# Patient Record
Sex: Female | Born: 1980
Health system: Southern US, Community
[De-identification: ages and names within clinical notes are randomized; demographics above are authoritative.]

## PROBLEM LIST (undated history)

## (undated) DIAGNOSIS — R51 Headache: Secondary | ICD-10-CM

## (undated) DIAGNOSIS — N200 Calculus of kidney: Secondary | ICD-10-CM

## (undated) DIAGNOSIS — Z9889 Other specified postprocedural states: Secondary | ICD-10-CM

## (undated) DIAGNOSIS — K429 Umbilical hernia without obstruction or gangrene: Secondary | ICD-10-CM

## (undated) DIAGNOSIS — O009 Unspecified ectopic pregnancy without intrauterine pregnancy: Secondary | ICD-10-CM

## (undated) DIAGNOSIS — J310 Chronic rhinitis: Secondary | ICD-10-CM

## (undated) DIAGNOSIS — K5900 Constipation, unspecified: Secondary | ICD-10-CM

## (undated) DIAGNOSIS — R591 Generalized enlarged lymph nodes: Secondary | ICD-10-CM

## (undated) DIAGNOSIS — D649 Anemia, unspecified: Secondary | ICD-10-CM

## (undated) DIAGNOSIS — J329 Chronic sinusitis, unspecified: Secondary | ICD-10-CM

## (undated) DIAGNOSIS — T7840XA Allergy, unspecified, initial encounter: Secondary | ICD-10-CM

## (undated) DIAGNOSIS — S46009A Unspecified injury of muscle(s) and tendon(s) of the rotator cuff of unspecified shoulder, initial encounter: Secondary | ICD-10-CM

## (undated) DIAGNOSIS — R112 Nausea with vomiting, unspecified: Secondary | ICD-10-CM

## (undated) DIAGNOSIS — M779 Enthesopathy, unspecified: Secondary | ICD-10-CM

## (undated) HISTORY — DX: Allergy, unspecified, initial encounter: T78.40XA

## (undated) HISTORY — DX: Anemia, unspecified: D64.9

## (undated) HISTORY — PX: APPENDECTOMY: SHX54

---

## 2012-06-04 DIAGNOSIS — O009 Unspecified ectopic pregnancy without intrauterine pregnancy: Secondary | ICD-10-CM

## 2012-06-04 HISTORY — DX: Unspecified ectopic pregnancy without intrauterine pregnancy: O00.90

## 2013-03-02 LAB — OB RESULTS CONSOLE GC/CHLAMYDIA
CHLAMYDIA, DNA PROBE: NEGATIVE
GC PROBE AMP, GENITAL: NEGATIVE

## 2013-03-02 LAB — OB RESULTS CONSOLE ABO/RH: RH Type: NEGATIVE

## 2013-03-02 LAB — OB RESULTS CONSOLE HEPATITIS B SURFACE ANTIGEN: HEP B S AG: NEGATIVE

## 2013-03-02 LAB — OB RESULTS CONSOLE ANTIBODY SCREEN: Antibody Screen: NEGATIVE

## 2013-03-02 LAB — OB RESULTS CONSOLE HIV ANTIBODY (ROUTINE TESTING): HIV: NONREACTIVE

## 2013-03-02 LAB — OB RESULTS CONSOLE RPR: RPR: NONREACTIVE

## 2013-03-02 LAB — OB RESULTS CONSOLE RUBELLA ANTIBODY, IGM: RUBELLA: IMMUNE

## 2013-07-05 ENCOUNTER — Encounter (HOSPITAL_COMMUNITY): Payer: Self-pay | Admitting: *Deleted

## 2013-07-05 ENCOUNTER — Inpatient Hospital Stay (HOSPITAL_COMMUNITY)
Admission: AD | Admit: 2013-07-05 | Discharge: 2013-07-05 | Disposition: A | Discharge status: Home / Self Care | Payer: BC Managed Care – PPO | Source: Ambulatory Visit | Attending: Obstetrics and Gynecology | Admitting: Obstetrics and Gynecology

## 2013-07-05 DIAGNOSIS — R109 Unspecified abdominal pain: Secondary | ICD-10-CM | POA: Insufficient documentation

## 2013-07-05 DIAGNOSIS — K5289 Other specified noninfective gastroenteritis and colitis: Secondary | ICD-10-CM

## 2013-07-05 DIAGNOSIS — O9989 Other specified diseases and conditions complicating pregnancy, childbirth and the puerperium: Principal | ICD-10-CM

## 2013-07-05 DIAGNOSIS — O99891 Other specified diseases and conditions complicating pregnancy: Secondary | ICD-10-CM | POA: Insufficient documentation

## 2013-07-05 DIAGNOSIS — M549 Dorsalgia, unspecified: Secondary | ICD-10-CM | POA: Insufficient documentation

## 2013-07-05 DIAGNOSIS — R1110 Vomiting, unspecified: Secondary | ICD-10-CM | POA: Insufficient documentation

## 2013-07-05 DIAGNOSIS — K529 Noninfective gastroenteritis and colitis, unspecified: Secondary | ICD-10-CM

## 2013-07-05 HISTORY — DX: Unspecified ectopic pregnancy without intrauterine pregnancy: O00.90

## 2013-07-05 LAB — URINE MICROSCOPIC-ADD ON

## 2013-07-05 LAB — URINALYSIS, ROUTINE W REFLEX MICROSCOPIC
Bilirubin Urine: NEGATIVE
Glucose, UA: NEGATIVE mg/dL
HGB URINE DIPSTICK: NEGATIVE
Ketones, ur: NEGATIVE mg/dL
Nitrite: NEGATIVE
PROTEIN: NEGATIVE mg/dL
Specific Gravity, Urine: 1.02 (ref 1.005–1.030)
UROBILINOGEN UA: 0.2 mg/dL (ref 0.0–1.0)
pH: 6.5 (ref 5.0–8.0)

## 2013-07-05 MED ORDER — ONDANSETRON HCL 8 MG PO TABS: 8.0000 mg | ORAL_TABLET | Freq: Three times a day (TID) | ORAL | Status: DC | PRN

## 2013-07-05 MED ORDER — ONDANSETRON 8 MG PO TBDP
8.0000 mg | ORAL_TABLET | Freq: Once | ORAL | Status: AC
  Administered 2013-07-05: 8 mg via ORAL
  Filled 2013-07-05: qty 1

## 2013-07-05 NOTE — Discharge Instructions (Signed)
Viral Gastroenteritis Viral gastroenteritis is also known as stomach flu. This condition affects the stomach and intestinal tract. It can cause sudden diarrhea and vomiting. The illness typically lasts 3 to 8 days. Most people develop an immune response that eventually gets rid of the virus. While this natural response develops, the virus can make you quite ill. CAUSES  Many different viruses can cause gastroenteritis, such as rotavirus or noroviruses. You can catch one of these viruses by consuming contaminated food or water. You may also catch a virus by sharing utensils or other personal items with an infected person or by touching a contaminated surface. SYMPTOMS  The most common symptoms are diarrhea and vomiting. These problems can cause a severe loss of body fluids (dehydration) and a body salt (electrolyte) imbalance. Other symptoms may include:  Fever.  Headache.  Fatigue.  Abdominal pain. DIAGNOSIS  Your caregiver can usually diagnose viral gastroenteritis based on your symptoms and a physical exam. A stool sample may also be taken to test for the presence of viruses or other infections. TREATMENT  This illness typically goes away on its own. Treatments are aimed at rehydration. The most serious cases of viral gastroenteritis involve vomiting so severely that you are not able to keep fluids down. In these cases, fluids must be given through an intravenous line (IV). HOME CARE INSTRUCTIONS   Drink enough fluids to keep your urine clear or pale yellow. Drink small amounts of fluids frequently and increase the amounts as tolerated.  Ask your caregiver for specific rehydration instructions.  Avoid:  Foods high in sugar.  Alcohol.  Carbonated drinks.  Tobacco.  Juice.  Caffeine drinks.  Extremely hot or cold fluids.  Fatty, greasy foods.  Too much intake of anything at one time.  Dairy products until 24 to 48 hours after diarrhea stops.  You may consume probiotics.  Probiotics are active cultures of beneficial bacteria. They may lessen the amount and number of diarrheal stools in adults. Probiotics can be found in yogurt with active cultures and in supplements.  Wash your hands well to avoid spreading the virus.  Only take over-the-counter or prescription medicines for pain, discomfort, or fever as directed by your caregiver. Do not give aspirin to children. Antidiarrheal medicines are not recommended.  Ask your caregiver if you should continue to take your regular prescribed and over-the-counter medicines.  Keep all follow-up appointments as directed by your caregiver. SEEK IMMEDIATE MEDICAL CARE IF:   You are unable to keep fluids down.  You do not urinate at least once every 6 to 8 hours.  You develop shortness of breath.  You notice blood in your stool or vomit. This may look like coffee grounds.  You have abdominal pain that increases or is concentrated in one small area (localized).  You have persistent vomiting or diarrhea.  You have a fever.  The patient is a child younger than 3 months, and he or she has a fever.  The patient is a child older than 3 months, and he or she has a fever and persistent symptoms.  The patient is a child older than 3 months, and he or she has a fever and symptoms suddenly get worse.  The patient is a baby, and he or she has no tears when crying. MAKE SURE YOU:   Understand these instructions.  Will watch your condition.  Will get help right away if you are not doing well or get worse. Document Released: 05/21/2005 Document Revised: 08/13/2011 Document Reviewed: 03/07/2011   [employer] Patient Information 2014 [employer].  

## 2013-07-05 NOTE — MAU Provider Note (Signed)
  History     CSN: [id number]  Arrival date and time: 07/05/13 1840   First Provider Initiated Contact with Patient 07/05/13 1904      Chief Complaint  Patient presents with  . Emesis  . Abdominal Pain  . Back Pain   HPI Tiffany Carter 33 y.o. [redacted]w[redacted]d  Client had sudden onset of feeling bad and vomiting this afternoon.  Has vomited twice.  Does not have any diarrhea.  Does report some lower abdominal cramping.  OB History   Grav Para Term Preterm Abortions TAB SAB Ect Mult Living   1         0      History reviewed. No pertinent past medical history.  No past surgical history on file.  No family history on file.  History  Substance Use Topics  . Smoking status: Not on file  . Smokeless tobacco: Not on file  . Alcohol Use: Not on file    Allergies:  Allergies  Allergen Reactions  . Penicillins Hives    No prescriptions prior to admission    Review of Systems  Constitutional: Negative for fever.  Gastrointestinal: Positive for nausea, vomiting and abdominal pain. Negative for diarrhea.  Genitourinary:       No vaginal discharge. No vaginal bleeding. No dysuria.   Physical Exam   Blood pressure 111/60, pulse 84, temperature 97.8 F (36.6 C), temperature source Oral, resp. rate 18, height 5\' 4"  (1.626 m), weight 136 lb (61.689 kg), last menstrual period 01/28/2013.  Physical Exam  Nursing note and vitals reviewed. Constitutional: She is oriented to person, place, and time. She appears well-developed and well-nourished.  HENT:  Head: Normocephalic.  Eyes: EOM are normal.  Neck: Neck supple.  Respiratory: Effort normal.  GI: Soft. There is no tenderness.  Lower abdomen is very soft.  No contractions palpated.  Musculoskeletal: Normal range of motion.  Neurological: She is alert and oriented to person, place, and time.  Skin: Skin is warm and dry.  Psychiatric: She has a normal mood and affect.    MAU Course  Procedures  MDM  Care assumed by M.  Jimmye Norman, CNM  Assessment and Plan    BURLESON,TERRI 07/05/2013, 7:10 PM   Zofran given with excellent relief But patient has now developed loose stools, so this is probably gastroenteritis.  Wants to go home.  Rx Zofran given.  Dehydration precautions.   Seabron Spates, CNM

## 2013-07-05 NOTE — MAU Note (Signed)
Patient states she started having abdominal and back pain with vomiting this afternoon. Denies bleeding or leaking and reports feeling fetal movement.

## 2013-10-19 ENCOUNTER — Encounter (HOSPITAL_COMMUNITY): Payer: Self-pay | Admitting: Pharmacist

## 2013-10-27 ENCOUNTER — Encounter (HOSPITAL_COMMUNITY): Payer: Self-pay

## 2013-10-27 ENCOUNTER — Other Ambulatory Visit (HOSPITAL_COMMUNITY): Payer: BC Managed Care – PPO

## 2013-10-27 ENCOUNTER — Encounter (HOSPITAL_COMMUNITY)
Admission: RE | Admit: 2013-10-27 | Discharge: 2013-10-27 | Disposition: A | Discharge status: Home / Self Care | Payer: BC Managed Care – PPO | Source: Ambulatory Visit | Attending: Obstetrics & Gynecology | Admitting: Obstetrics & Gynecology

## 2013-10-27 HISTORY — DX: Other specified postprocedural states: Z98.890

## 2013-10-27 HISTORY — DX: Enthesopathy, unspecified: M77.9

## 2013-10-27 HISTORY — DX: Headache: R51

## 2013-10-27 HISTORY — DX: Nausea with vomiting, unspecified: R11.2

## 2013-10-27 HISTORY — DX: Unspecified injury of muscle(s) and tendon(s) of the rotator cuff of unspecified shoulder, initial encounter: S46.009A

## 2013-10-27 LAB — CBC
HCT: 37.2 % (ref 36.0–46.0)
Hemoglobin: 12.9 g/dL (ref 12.0–15.0)
MCH: 30.7 pg (ref 26.0–34.0)
MCHC: 34.7 g/dL (ref 30.0–36.0)
MCV: 88.6 fL (ref 78.0–100.0)
PLATELETS: 175 10*3/uL (ref 150–400)
RBC: 4.2 MIL/uL (ref 3.87–5.11)
RDW: 13.6 % (ref 11.5–15.5)
WBC: 8.7 10*3/uL (ref 4.0–10.5)

## 2013-10-27 LAB — PREPARE RBC (CROSSMATCH)

## 2013-10-27 LAB — RPR

## 2013-10-27 NOTE — Patient Instructions (Addendum)
Your procedure is scheduled on: Wednesday, Oct 28, 2013  Enter through the [employer] [facility] at: 6:00am  Pick up the phone at the desk and dial [phone].  Call this number if you have problems the morning of surgery: [phone].  Remember: Do NOT eat food:  After midnight tonight Do NOT drink clear liquids after:  After midnight tonight Take these medicines the morning of surgery with a SIP OF WATER: None  Do NOT wear jewelry (body piercing), metal hair clips/bobby pins, make-up, or nail polish. Do NOT wear lotions, powders, or perfumes.  You may wear deoderant. Do NOT shave for 48 hours prior to surgery. Do NOT bring valuables to the hospital.   Leave suitcase in car.  After surgery it may be brought to your room.  For patients admitted to the hospital, checkout time is 11:00 AM the day of discharge.

## 2013-10-27 NOTE — H&P (Signed)
Tiffany Carter is a 33 y.o. female presenting for repeat C/S with BTL.  Her antepartum course has been uncomplicated.  She is Rh negative and GBS negative.    Maternal Medical History:  Fetal activity: Perceived fetal activity is normal.   Last perceived fetal movement was within the past hour.    Prenatal complications: no prenatal complications Prenatal Complications - Diabetes: none.    OB History   Grav Para Term Preterm Abortions TAB SAB Ect Mult Living   4 1 1  2  1 1  1      Past Medical History  Diagnosis Date  . Ectopic pregnancy 2014  . Headache(784.0)     history of migraines over 15 years ago  . Tendonitis     left  . Rotator cuff injury     right  . PONV (postoperative nausea and vomiting)    Past Surgical History  Procedure Laterality Date  . Appendectomy    . Cesarean section     Family History: family history includes Cancer in her mother. Social History:  reports that she has never smoked. She has never used smokeless tobacco. She reports that she drinks alcohol. She reports that she does not use illicit drugs.   Prenatal Transfer Tool  Maternal Diabetes: No Genetic Screening: Normal Maternal Ultrasounds/Referrals: Normal Fetal Ultrasounds or other Referrals:  None Maternal Substance Abuse:  No Significant Maternal Medications:  None Significant Maternal Lab Results:  Lab values include: Group B Strep negative Other Comments:  None  ROS    Last menstrual period 01/28/2013, unknown if currently breastfeeding. Maternal Exam:  Abdomen: Patient reports no abdominal tenderness. Surgical scars: low transverse.   Fundal height is c/w dates.   Estimated fetal weight is 7#4.       Physical Exam  Constitutional: She is oriented to person, place, and time. She appears well-developed and well-nourished.  GI: Soft. There is no rebound and no guarding.  Neurological: She is alert and oriented to person, place, and time.  Skin: Skin is warm and dry.   Psychiatric: She has a normal mood and affect. Her behavior is normal.    Prenatal labs: ABO, Rh: --/--/O NEG (05/26 1440) Antibody: POS (05/26 1440) Rubella: Immune (09/29 0000) RPR: Nonreactive (09/29 0000)  HBsAg: Negative (09/29 0000)  HIV: Non-reactive (09/29 0000)  GBS:     Assessment/Plan: 33yo [id number] at 39 weeks with h/o C/S and desire for permanent sterilization Repeat C/S with BTL  Linda Hedges 10/27/2013, 8:08 PM

## 2013-10-28 ENCOUNTER — Encounter (HOSPITAL_COMMUNITY): Payer: BC Managed Care – PPO | Admitting: Anesthesiology

## 2013-10-28 ENCOUNTER — Encounter (HOSPITAL_COMMUNITY): Payer: Self-pay | Admitting: *Deleted

## 2013-10-28 ENCOUNTER — Inpatient Hospital Stay (HOSPITAL_COMMUNITY): Payer: BC Managed Care – PPO | Admitting: Anesthesiology

## 2013-10-28 ENCOUNTER — Encounter (HOSPITAL_COMMUNITY)
Admission: RE | Disposition: A | Discharge status: Home / Self Care | Payer: Self-pay | Source: Ambulatory Visit | Attending: Obstetrics & Gynecology

## 2013-10-28 ENCOUNTER — Inpatient Hospital Stay (HOSPITAL_COMMUNITY)
Admission: RE | Admit: 2013-10-28 | Discharge: 2013-10-30 | DRG: 766 | Disposition: A | Discharge status: Home / Self Care | Payer: BC Managed Care – PPO | Source: Ambulatory Visit | Attending: Obstetrics & Gynecology | Admitting: Obstetrics & Gynecology

## 2013-10-28 DIAGNOSIS — Z302 Encounter for sterilization: Secondary | ICD-10-CM

## 2013-10-28 DIAGNOSIS — O360990 Maternal care for other rhesus isoimmunization, unspecified trimester, not applicable or unspecified: Secondary | ICD-10-CM | POA: Diagnosis present

## 2013-10-28 DIAGNOSIS — O34219 Maternal care for unspecified type scar from previous cesarean delivery: Principal | ICD-10-CM | POA: Diagnosis present

## 2013-10-28 DIAGNOSIS — Z98891 History of uterine scar from previous surgery: Secondary | ICD-10-CM

## 2013-10-28 SURGERY — Surgical Case
Anesthesia: Spinal | Site: Abdomen | Laterality: Bilateral

## 2013-10-28 MED ORDER — OXYTOCIN 10 UNIT/ML IJ SOLN
INTRAMUSCULAR | Status: AC
  Filled 2013-10-28: qty 4

## 2013-10-28 MED ORDER — ONDANSETRON 8 MG/NS 50 ML IVPB
8.0000 mg | Freq: Once | INTRAVENOUS | Status: DC
  Filled 2013-10-28: qty 8

## 2013-10-28 MED ORDER — PHENYLEPHRINE 8 MG IN D5W 100 ML (0.08MG/ML) PREMIX OPTIME
INJECTION | INTRAVENOUS | Status: DC | PRN
  Administered 2013-10-28: 60 ug/min via INTRAVENOUS

## 2013-10-28 MED ORDER — DIPHENHYDRAMINE HCL 50 MG/ML IJ SOLN: 25.0000 mg | INTRAMUSCULAR | Status: DC | PRN

## 2013-10-28 MED ORDER — ONDANSETRON HCL 4 MG/2ML IJ SOLN
INTRAMUSCULAR | Status: DC | PRN
  Administered 2013-10-28: 4 mg via INTRAVENOUS

## 2013-10-28 MED ORDER — SCOPOLAMINE 1 MG/3DAYS TD PT72
1.0000 | MEDICATED_PATCH | Freq: Once | TRANSDERMAL | Status: DC
  Administered 2013-10-28: 1.5 mg via TRANSDERMAL

## 2013-10-28 MED ORDER — DIPHENHYDRAMINE HCL 25 MG PO CAPS: 25.0000 mg | ORAL_CAPSULE | ORAL | Status: DC | PRN

## 2013-10-28 MED ORDER — SIMETHICONE 80 MG PO CHEW
80.0000 mg | CHEWABLE_TABLET | ORAL | Status: DC | PRN
  Administered 2013-10-29: 80 mg via ORAL

## 2013-10-28 MED ORDER — OXYTOCIN 40 UNITS IN LACTATED RINGERS INFUSION - SIMPLE MED: 62.5000 mL/h | INTRAVENOUS | Status: AC

## 2013-10-28 MED ORDER — ONDANSETRON HCL 4 MG/2ML IJ SOLN
INTRAMUSCULAR | Status: AC
  Filled 2013-10-28: qty 2

## 2013-10-28 MED ORDER — IBUPROFEN 600 MG PO TABS
600.0000 mg | ORAL_TABLET | Freq: Four times a day (QID) | ORAL | Status: DC
  Administered 2013-10-29 – 2013-10-30 (×6): 600 mg via ORAL
  Filled 2013-10-28 (×6): qty 1

## 2013-10-28 MED ORDER — METOCLOPRAMIDE HCL 5 MG/ML IJ SOLN
INTRAMUSCULAR | Status: AC
  Filled 2013-10-28: qty 2

## 2013-10-28 MED ORDER — TETANUS-DIPHTH-ACELL PERTUSSIS 5-2.5-18.5 LF-MCG/0.5 IM SUSP: 0.5000 mL | Freq: Once | INTRAMUSCULAR | Status: DC

## 2013-10-28 MED ORDER — SCOPOLAMINE 1 MG/3DAYS TD PT72
1.0000 | MEDICATED_PATCH | Freq: Once | TRANSDERMAL | Status: DC
  Filled 2013-10-28: qty 1

## 2013-10-28 MED ORDER — LANOLIN HYDROUS EX OINT: 1.0000 "application " | TOPICAL_OINTMENT | CUTANEOUS | Status: DC | PRN

## 2013-10-28 MED ORDER — OXYTOCIN 10 UNIT/ML IJ SOLN
40.0000 [IU] | INTRAVENOUS | Status: DC | PRN
  Administered 2013-10-28: 40 [IU] via INTRAVENOUS

## 2013-10-28 MED ORDER — FENTANYL CITRATE 0.05 MG/ML IJ SOLN: 25.0000 ug | INTRAMUSCULAR | Status: DC | PRN

## 2013-10-28 MED ORDER — SIMETHICONE 80 MG PO CHEW
80.0000 mg | CHEWABLE_TABLET | ORAL | Status: DC
  Administered 2013-10-29 (×2): 80 mg via ORAL
  Filled 2013-10-28 (×2): qty 1

## 2013-10-28 MED ORDER — SODIUM CHLORIDE 0.9 % IJ SOLN
3.0000 mL | INTRAMUSCULAR | Status: DC | PRN
Start: 2013-10-28 — End: 2013-10-30

## 2013-10-28 MED ORDER — ONDANSETRON HCL 4 MG/2ML IJ SOLN: 4.0000 mg | INTRAMUSCULAR | Status: DC | PRN

## 2013-10-28 MED ORDER — SIMETHICONE 80 MG PO CHEW
80.0000 mg | CHEWABLE_TABLET | Freq: Three times a day (TID) | ORAL | Status: DC
  Administered 2013-10-29 – 2013-10-30 (×3): 80 mg via ORAL
  Filled 2013-10-28 (×4): qty 1

## 2013-10-28 MED ORDER — GENTAMICIN SULFATE 40 MG/ML IJ SOLN
INTRAVENOUS | Status: AC
  Administered 2013-10-28: 100 mL via INTRAVENOUS
  Filled 2013-10-28: qty 7.5

## 2013-10-28 MED ORDER — MEPERIDINE HCL 25 MG/ML IJ SOLN: 6.2500 mg | INTRAMUSCULAR | Status: DC | PRN

## 2013-10-28 MED ORDER — ZOLPIDEM TARTRATE 5 MG PO TABS: 5.0000 mg | ORAL_TABLET | Freq: Every evening | ORAL | Status: DC | PRN

## 2013-10-28 MED ORDER — WITCH HAZEL-GLYCERIN EX PADS: 1.0000 "application " | MEDICATED_PAD | CUTANEOUS | Status: DC | PRN

## 2013-10-28 MED ORDER — BUPIVACAINE IN DEXTROSE 0.75-8.25 % IT SOLN
INTRATHECAL | Status: DC | PRN
  Administered 2013-10-28: 11 mg via INTRATHECAL

## 2013-10-28 MED ORDER — DIPHENHYDRAMINE HCL 50 MG/ML IJ SOLN: 12.5000 mg | INTRAMUSCULAR | Status: DC | PRN

## 2013-10-28 MED ORDER — FENTANYL CITRATE 0.05 MG/ML IJ SOLN
INTRAMUSCULAR | Status: DC | PRN
  Administered 2013-10-28: 25 ug via INTRATHECAL

## 2013-10-28 MED ORDER — LACTATED RINGERS IV SOLN
INTRAVENOUS | Status: DC | PRN
  Administered 2013-10-28 (×3): via INTRAVENOUS

## 2013-10-28 MED ORDER — ONDANSETRON HCL 4 MG/2ML IJ SOLN
INTRAMUSCULAR | Status: AC
  Administered 2013-10-28: 8 mg
  Filled 2013-10-28: qty 4

## 2013-10-28 MED ORDER — NALOXONE HCL 0.4 MG/ML IJ SOLN: 0.4000 mg | INTRAMUSCULAR | Status: DC | PRN

## 2013-10-28 MED ORDER — KETOROLAC TROMETHAMINE 30 MG/ML IJ SOLN
30.0000 mg | Freq: Four times a day (QID) | INTRAMUSCULAR | Status: AC | PRN
  Administered 2013-10-28: 30 mg via INTRAMUSCULAR

## 2013-10-28 MED ORDER — SENNOSIDES-DOCUSATE SODIUM 8.6-50 MG PO TABS
2.0000 | ORAL_TABLET | ORAL | Status: DC
  Administered 2013-10-29 (×2): 2 via ORAL
  Filled 2013-10-28 (×2): qty 2

## 2013-10-28 MED ORDER — DIPHENHYDRAMINE HCL 25 MG PO CAPS: 25.0000 mg | ORAL_CAPSULE | Freq: Four times a day (QID) | ORAL | Status: DC | PRN

## 2013-10-28 MED ORDER — LACTATED RINGERS IV SOLN
INTRAVENOUS | Status: DC
  Administered 2013-10-28 (×2): via INTRAVENOUS

## 2013-10-28 MED ORDER — FENTANYL CITRATE 0.05 MG/ML IJ SOLN
INTRAMUSCULAR | Status: AC
  Filled 2013-10-28: qty 2

## 2013-10-28 MED ORDER — DEXTROSE IN LACTATED RINGERS 5 % IV SOLN: INTRAVENOUS | Status: DC

## 2013-10-28 MED ORDER — SCOPOLAMINE 1 MG/3DAYS TD PT72
MEDICATED_PATCH | TRANSDERMAL | Status: AC
  Administered 2013-10-28: 1.5 mg via TRANSDERMAL
  Filled 2013-10-28: qty 1

## 2013-10-28 MED ORDER — MORPHINE SULFATE (PF) 0.5 MG/ML IJ SOLN
INTRAMUSCULAR | Status: DC | PRN
  Administered 2013-10-28: .15 mg via INTRATHECAL

## 2013-10-28 MED ORDER — LACTATED RINGERS IV SOLN
INTRAVENOUS | Status: DC
  Administered 2013-10-28: 07:00:00 via INTRAVENOUS

## 2013-10-28 MED ORDER — NALOXONE HCL 1 MG/ML IJ SOLN
1.0000 ug/kg/h | INTRAVENOUS | Status: DC | PRN
  Filled 2013-10-28: qty 2

## 2013-10-28 MED ORDER — ONDANSETRON HCL 4 MG PO TABS: 4.0000 mg | ORAL_TABLET | ORAL | Status: DC | PRN

## 2013-10-28 MED ORDER — PRENATAL MULTIVITAMIN CH
1.0000 | ORAL_TABLET | Freq: Every day | ORAL | Status: DC
  Administered 2013-10-29: 1 via ORAL
  Filled 2013-10-28: qty 1

## 2013-10-28 MED ORDER — ONDANSETRON HCL 4 MG/2ML IJ SOLN: 4.0000 mg | Freq: Three times a day (TID) | INTRAMUSCULAR | Status: DC | PRN

## 2013-10-28 MED ORDER — NALBUPHINE HCL 10 MG/ML IJ SOLN: 5.0000 mg | INTRAMUSCULAR | Status: DC | PRN

## 2013-10-28 MED ORDER — MORPHINE SULFATE 0.5 MG/ML IJ SOLN
INTRAMUSCULAR | Status: AC
  Filled 2013-10-28: qty 10

## 2013-10-28 MED ORDER — DIBUCAINE 1 % RE OINT: 1.0000 "application " | TOPICAL_OINTMENT | RECTAL | Status: DC | PRN

## 2013-10-28 MED ORDER — KETOROLAC TROMETHAMINE 30 MG/ML IJ SOLN
30.0000 mg | Freq: Four times a day (QID) | INTRAMUSCULAR | Status: AC | PRN
  Administered 2013-10-28: 30 mg via INTRAVENOUS
  Filled 2013-10-28: qty 1

## 2013-10-28 MED ORDER — HYDROCODONE-ACETAMINOPHEN 5-325 MG PO TABS
1.0000 | ORAL_TABLET | ORAL | Status: DC | PRN
  Administered 2013-10-29 (×2): 1 via ORAL
  Administered 2013-10-29: 0.5 via ORAL
  Administered 2013-10-29 – 2013-10-30 (×6): 1 via ORAL
  Filled 2013-10-28 (×9): qty 1

## 2013-10-28 MED ORDER — MENTHOL 3 MG MT LOZG: 1.0000 | LOZENGE | OROMUCOSAL | Status: DC | PRN

## 2013-10-28 MED ORDER — KETOROLAC TROMETHAMINE 30 MG/ML IJ SOLN
INTRAMUSCULAR | Status: AC
  Administered 2013-10-28: 30 mg via INTRAMUSCULAR
  Filled 2013-10-28: qty 1

## 2013-10-28 MED ORDER — METOCLOPRAMIDE HCL 5 MG/ML IJ SOLN
10.0000 mg | Freq: Three times a day (TID) | INTRAMUSCULAR | Status: DC | PRN
  Administered 2013-10-28: 10 mg via INTRAVENOUS

## 2013-10-28 MED ORDER — PHENYLEPHRINE 8 MG IN D5W 100 ML (0.08MG/ML) PREMIX OPTIME
INJECTION | INTRAVENOUS | Status: AC
  Filled 2013-10-28: qty 100

## 2013-10-28 SURGICAL SUPPLY — 34 items
BENZOIN TINCTURE PRP APPL 2/3 (GAUZE/BANDAGES/DRESSINGS) ×2 IMPLANT
CLAMP CORD UMBIL (MISCELLANEOUS) IMPLANT
CLOTH BEACON ORANGE TIMEOUT ST (SAFETY) ×2 IMPLANT
DERMABOND ADVANCED (GAUZE/BANDAGES/DRESSINGS)
DERMABOND ADVANCED .7 DNX12 (GAUZE/BANDAGES/DRESSINGS) IMPLANT
DRAPE LG THREE QUARTER DISP (DRAPES) IMPLANT
DRSG OPSITE POSTOP 4X10 (GAUZE/BANDAGES/DRESSINGS) ×2 IMPLANT
DURAPREP 26ML APPLICATOR (WOUND CARE) ×2 IMPLANT
ELECT REM PT RETURN 9FT ADLT (ELECTROSURGICAL) ×2
ELECTRODE REM PT RTRN 9FT ADLT (ELECTROSURGICAL) ×1 IMPLANT
EXTRACTOR VACUUM KIWI (MISCELLANEOUS) IMPLANT
EXTRACTOR VACUUM M CUP 4 TUBE (SUCTIONS) IMPLANT
GLOVE BIO SURGEON STRL SZ 6 (GLOVE) ×2 IMPLANT
GLOVE BIOGEL PI IND STRL 6 (GLOVE) ×2 IMPLANT
GLOVE BIOGEL PI INDICATOR 6 (GLOVE) ×2
GOWN STRL REUS W/TWL LRG LVL3 (GOWN DISPOSABLE) ×4 IMPLANT
KIT ABG SYR 3ML LUER SLIP (SYRINGE) ×2 IMPLANT
NEEDLE HYPO 25X5/8 SAFETYGLIDE (NEEDLE) ×2 IMPLANT
NS IRRIG 1000ML POUR BTL (IV SOLUTION) ×2 IMPLANT
PACK C SECTION WH (CUSTOM PROCEDURE TRAY) ×2 IMPLANT
PAD OB MATERNITY 4.3X12.25 (PERSONAL CARE ITEMS) ×2 IMPLANT
SPONGE SURGIFOAM ABS GEL 12-7 (HEMOSTASIS) ×2 IMPLANT
STAPLER VISISTAT 35W (STAPLE) IMPLANT
STRIP CLOSURE SKIN 1/2X4 (GAUZE/BANDAGES/DRESSINGS) ×2 IMPLANT
SUT CHROMIC 0 CTX 36 (SUTURE) ×6 IMPLANT
SUT CHROMIC 3 0 SH 27 (SUTURE) ×2 IMPLANT
SUT MON AB 2-0 CT1 27 (SUTURE) ×2 IMPLANT
SUT PDS AB 0 CT1 27 (SUTURE) IMPLANT
SUT PLAIN 0 NONE (SUTURE) IMPLANT
SUT VIC AB 0 CT1 36 (SUTURE) IMPLANT
SUT VIC AB 4-0 KS 27 (SUTURE) IMPLANT
TOWEL OR 17X24 6PK STRL BLUE (TOWEL DISPOSABLE) ×2 IMPLANT
TRAY FOLEY CATH 14FR (SET/KITS/TRAYS/PACK) IMPLANT
WATER STERILE IRR 1000ML POUR (IV SOLUTION) ×2 IMPLANT

## 2013-10-28 NOTE — Op Note (Signed)
Tiffany Carter PROCEDURE DATE: 10/28/2013  PREOPERATIVE DIAGNOSIS: Intrauterine pregnancy at  [redacted]w[redacted]d weeks gestation with previous C/S and desire for permanent sterilization  POSTOPERATIVE DIAGNOSIS: The same  PROCEDURE:   Repeat Low Transverse Cesarean Section with Bilateral Tubal Ligation  SURGEON:  Dr. Linda Hedges  INDICATIONS: Tiffany Carter is a 33 y.o. [id number] at [redacted]w[redacted]d scheduled for cesarean section and BTL secondary to desire for repeat and permanent sterilization.  The risks of cesarean section discussed with the patient included but were not limited to: bleeding which may require transfusion or reoperation; infection which may require antibiotics; injury to bowel, bladder, ureters or other surrounding organs; injury to the fetus; need for additional procedures including hysterectomy in the event of a life-threatening hemorrhage; placental abnormalities wth subsequent pregnancies, incisional problems, thromboembolic phenomenon and other postoperative/anesthesia complications.  Additionally, regarding the BTL, the patient is informed of the risk of failure and regret. The patient concurred with the proposed plan, giving informed written consent for the procedure.    FINDINGS:  Viable female infant in cephalic presentation, APGARs 9,9:  Weight pending  Clear amniotic fluid.  Intact placenta, three vessel cord.  Grossly normal uterus, ovaries and fallopian tubes. .   ANESTHESIA:    Spinal ESTIMATED BLOOD LOSS: 800 ml SPECIMENS: Placenta sent to L&D, tubal segments to pathology COMPLICATIONS: None immediate  PROCEDURE IN DETAIL:  The patient received intravenous antibiotics and had sequential compression devices applied to her lower extremities while in the preoperative area.  She was then taken to the operating room where spinal anesthesia was administered and was found to be adequate. She was then placed in a dorsal supine position with a leftward tilt, and prepped and draped in a sterile  manner.  A foley catheter was placed into her bladder and attached to constant gravity.  After an adequate timeout was performed, a Pfannenstiel skin incision was made with scalpel and carried through to the underlying layer of fascia. The fascia was incised in the midline and this incision was extended bilaterally using the Mayo scissors. Kocher clamps were applied to the superior aspect of the fascial incision and the underlying rectus muscles were dissected off bluntly. A similar process was carried out on the inferior aspect of the facial incision. The rectus muscles were separated in the midline bluntly and the peritoneum was entered bluntly.  A bladder flap was created and protected behind the bladder blade.  A transverse hysterotomy was made with a scalpel and extended bilaterally bluntly. The bladder blade was then removed. The infant was successfully delivered, and cord was clamped and cut and infant was handed over to awaiting neonatology team. Uterine massage was then administered and the placenta delivered intact with three-vessel cord. The uterus was cleared of clot and debris.  The hysterotomy was closed with #1 chromic.  A second imbricating suture of #1 chromic was used to reinforce the incision and aid in hemostasis.  Attention was turned to the fallopian tubes which were each elevated, doubly tied with plain gut suture and tubal segment excised.  Hemostasis was noted.  The peritoneum and rectus muscles were noted to be hemostatic and were reapproximated using 3-0 monocryl in a running fashion.  The fascia was closed with 0-PDS in a running fashion with good restoration of anatomy.  The subcutaneus tissue was copiously irrigated.  The skin was closed with 4-0 Vicryl in a subcuticular fashion.  Pt tolerated the procedure will.  All counts were correct x2.  Pt went to the recovery room in  stable condition.

## 2013-10-28 NOTE — Anesthesia Postprocedure Evaluation (Signed)
  Anesthesia Post-op Note  Patient: Tiffany Carter  Procedure(s) Performed: Procedure(s) with comments: REPEAT CESAREAN SECTION WITH BILATERAL TUBAL LIGATION (Bilateral) - edc 11/04/13  Patient Location: PACU and Mother/Baby  Anesthesia Type:Spinal  Level of Consciousness: awake, alert  and oriented  Airway and Oxygen Therapy: Patient Spontanous Breathing  Post-op Pain: mild  Post-op Assessment: Patient's Cardiovascular Status Stable, Respiratory Function Stable, No signs of Nausea or vomiting, Adequate PO intake and Pain level controlled  Post-op Vital Signs: Reviewed and stable  Last Vitals:  Filed Vitals:   10/28/13 1500  BP: 96/59  Pulse: 67  Temp: 36.5 C  Resp: 18    Complications:No anesthetic complications.

## 2013-10-28 NOTE — Anesthesia Procedure Notes (Signed)
Spinal  Patient location during procedure: OR Start time: 10/28/2013 7:30 AM Staffing Anesthesiologist: Rudean Curt Performed by: anesthesiologist  Preanesthetic Checklist Completed: patient identified, site marked, surgical consent, pre-op evaluation, timeout performed, IV checked, risks and benefits discussed and monitors and equipment checked Spinal Block Patient position: sitting Prep: DuraPrep Patient monitoring: heart rate, cardiac monitor, continuous pulse ox and blood pressure Approach: midline Location: L3-4 Injection technique: single-shot Needle Needle type: Sprotte  Needle gauge: 24 G Needle length: 9 cm Assessment Sensory level: T4 Additional Notes Patient identified.  Risk benefits discussed including failed block, incomplete pain control, headache, nerve damage, paralysis, blood pressure changes, nausea, vomiting, reactions to medication both toxic or allergic, and postpartum back pain.  Patient expressed understanding and wished to proceed.  All questions were answered.  Sterile technique used throughout procedure.  CSF was clear.  No parasthesia or other complications.  Please see nursing notes for vital signs.

## 2013-10-28 NOTE — Addendum Note (Signed)
Addendum created 10/28/13 1601 by Elenore Paddy, CRNA   Modules edited: Notes Section   Notes Section:  File: [id number]

## 2013-10-28 NOTE — Anesthesia Preprocedure Evaluation (Signed)

## 2013-10-28 NOTE — Progress Notes (Signed)
No change to H&P. 

## 2013-10-28 NOTE — Anesthesia Postprocedure Evaluation (Deleted)
  Anesthesia Post-op Note  Patient: Tiffany Carter  Procedure(s) Performed: Procedure(s) with comments: REPEAT CESAREAN SECTION WITH BILATERAL TUBAL LIGATION (Bilateral) - edc 11/04/13  Patient Location: PACU  Anesthesia Type:Spinal  Level of Consciousness: awake  Airway and Oxygen Therapy: Patient Spontanous Breathing  Post-op Pain: none  Post-op Assessment: Post-op Vital signs reviewed  Post-op Vital Signs: Reviewed and stable  Last Vitals:  Filed Vitals:   10/28/13 0613  BP: 120/80  Pulse: 76  Temp: 36.7 C  Resp: 18    Complications: No apparent anesthesia complications

## 2013-10-28 NOTE — Anesthesia Postprocedure Evaluation (Signed)
Anesthesia Post Note  Patient: Tiffany Carter  Procedure(s) Performed: Procedure(s) (LRB): REPEAT CESAREAN SECTION WITH BILATERAL TUBAL LIGATION (Bilateral)  Anesthesia type: Spinal  Patient location: PACU  Post pain: Pain level controlled  Post assessment: Post-op Vital signs reviewed  Last Vitals:  Filed Vitals:   10/28/13 0915  BP: 108/58  Pulse: 69  Temp:   Resp: 18    Post vital signs: Reviewed  Level of consciousness: awake  Complications: No apparent anesthesia complications

## 2013-10-28 NOTE — Transfer of Care (Signed)
Immediate Anesthesia Transfer of Care Note  Patient: Tiffany Carter  Procedure(s) Performed: Procedure(s) with comments: REPEAT CESAREAN SECTION WITH BILATERAL TUBAL LIGATION (Bilateral) - edc 11/04/13  Patient Location: PACU  Anesthesia Type:Spinal  Level of Consciousness: awake  Airway & Oxygen Therapy: Patient Spontanous Breathing  Post-op Assessment: Report given to PACU RN  Post vital signs: Reviewed and stable  Complications: No apparent anesthesia complications

## 2013-10-29 ENCOUNTER — Encounter (HOSPITAL_COMMUNITY): Payer: Self-pay | Admitting: Obstetrics & Gynecology

## 2013-10-29 LAB — CBC
HEMATOCRIT: 29 % — AB (ref 36.0–46.0)
HEMOGLOBIN: 9.9 g/dL — AB (ref 12.0–15.0)
MCH: 30.6 pg (ref 26.0–34.0)
MCHC: 34.1 g/dL (ref 30.0–36.0)
MCV: 89.5 fL (ref 78.0–100.0)
Platelets: 148 10*3/uL — ABNORMAL LOW (ref 150–400)
RBC: 3.24 MIL/uL — ABNORMAL LOW (ref 3.87–5.11)
RDW: 13.8 % (ref 11.5–15.5)
WBC: 10.5 10*3/uL (ref 4.0–10.5)

## 2013-10-29 MED ORDER — RHO D IMMUNE GLOBULIN 1500 UNIT/2ML IJ SOSY
300.0000 ug | PREFILLED_SYRINGE | Freq: Once | INTRAMUSCULAR | Status: AC
  Administered 2013-10-29: 300 ug via INTRAMUSCULAR
  Filled 2013-10-29: qty 2

## 2013-10-29 NOTE — Progress Notes (Signed)
Subjective: Postpartum Day 1: Cesarean Delivery Patient reports tolerating PO.    Objective: Vital signs in last 24 hours: Temp:  [97.4 F (36.3 C)-98.9 F (37.2 C)] 98 F (36.7 C) (05/28 0343) Pulse Rate:  [64-88] 88 (05/28 0343) Resp:  [13-26] 18 (05/28 0343) BP: (86-115)/(52-73) 89/55 mmHg (05/28 0343) SpO2:  [93 %-99 %] 96 % (05/28 0343) Weight:  [157 lb 3 oz (71.3 kg)] 157 lb 3 oz (71.3 kg) (05/27 1038)  Physical Exam:  General: alert and cooperative Lochia: appropriate Uterine Fundus: firm Incision: honeycomb dressing CDI DVT Evaluation: No evidence of DVT seen on physical exam. Negative Homan's sign. No cords or calf tenderness. No significant calf/ankle edema.   Recent Labs  10/27/13 1440  HGB 12.9  HCT 37.2    Assessment/Plan: Status post Cesarean section. Doing well postoperatively.  Does desire circ, baby voiding.  Damien Fusi 10/29/2013, 8:11 AM

## 2013-10-29 NOTE — Lactation Note (Signed)
This note was copied from the chart of [place]. Lactation Consultation Note: Initial visit with this experienced BF mom. She reports that baby has been sleepy at the breast. Now 40 hours old. Mom has baby at the breast when I went in but with his clothes and hat on, Undressed and baby awakened better. Assisted mom in football hold and he was nursing well when I left room. For circ later today- reviewed normal behavior after circ and cluster feeding. BF brochure given with resources for support after DC. To call for assist prn.   Patient Name: Tiffany Carter [id number] Date: 10/29/2013 Reason for consult: Initial assessment   Maternal Data Formula Feeding for Exclusion: No Infant to breast within first hour of birth: Yes Has patient been taught Hand Expression?: Yes Does the patient have breastfeeding experience prior to this delivery?: Yes  Feeding Feeding Type: Breast Fed Length of feed: 20 min  LATCH Score/Interventions Latch: Grasps breast easily, tongue down, lips flanged, rhythmical sucking.  Audible Swallowing: A few with stimulation Intervention(s): Skin to skin  Type of Nipple: Everted at rest and after stimulation  Comfort (Breast/Nipple): Soft / non-tender     Hold (Positioning): Assistance needed to correctly position infant at breast and maintain latch. Intervention(s): Breastfeeding basics reviewed;Support Pillows;Position options;Skin to skin  LATCH Score: 8  Lactation Tools Discussed/Used     Consult Status Consult Status: Follow-up Date: 10/30/13 Follow-up type: In-patient    Truddie Crumble 10/29/2013, 10:45 AM

## 2013-10-30 LAB — RH IG WORKUP (INCLUDES ABO/RH)
ABO/RH(D): O NEG
Fetal Screen: NEGATIVE
Gestational Age(Wks): 39
UNIT DIVISION: 0

## 2013-10-30 LAB — TYPE AND SCREEN
ABO/RH(D): O NEG
Antibody Screen: POSITIVE
DAT, IgG: NEGATIVE
UNIT DIVISION: 0
Unit division: 0

## 2013-10-30 MED ORDER — HYDROCODONE-ACETAMINOPHEN 5-325 MG PO TABS: 1.0000 | ORAL_TABLET | ORAL | Status: DC | PRN

## 2013-10-30 MED ORDER — IBUPROFEN 600 MG PO TABS: 600.0000 mg | ORAL_TABLET | Freq: Four times a day (QID) | ORAL | Status: DC

## 2013-10-30 NOTE — Discharge Summary (Signed)
Obstetric Discharge Summary Reason for Admission: cesarean section Prenatal Procedures: ultrasound Intrapartum Procedures: cesarean: low cervical, transverse Postpartum Procedures: none Complications-Operative and Postpartum: none Hemoglobin  Date Value Ref Range Status  10/29/2013 9.9* 12.0 - 15.0 g/dL Final     DELTA CHECK NOTED     REPEATED TO VERIFY     HCT  Date Value Ref Range Status  10/29/2013 29.0* 36.0 - 46.0 % Final    Physical Exam:  General: alert and cooperative Lochia: appropriate Uterine Fundus: firm Incision: honeycomb dressing with small old drainage noted on bandage DVT Evaluation: No evidence of DVT seen on physical exam. Negative Homan's sign. No cords or calf tenderness. No significant calf/ankle edema.  Discharge Diagnoses: Term Pregnancy-delivered  Discharge Information: Date: 10/30/2013 Activity: pelvic rest Diet: routine Medications: PNV, Ibuprofen and Vicodin Condition: stable Instructions: refer to practice specific booklet Discharge to: home   Newborn Data: Live born female  Birth Weight: 7 lb 4.2 oz (3295 g) APGAR: 9, 9  Home with mother.  Damien Fusi 10/30/2013, 8:38 AM

## 2013-11-10 ENCOUNTER — Inpatient Hospital Stay (HOSPITAL_COMMUNITY)
Admission: AD | Admit: 2013-11-10 | Discharge: 2013-11-10 | Disposition: A | Discharge status: Home / Self Care | Payer: BC Managed Care – PPO | Source: Ambulatory Visit | Attending: Obstetrics and Gynecology | Admitting: Obstetrics and Gynecology

## 2013-11-10 ENCOUNTER — Encounter (HOSPITAL_COMMUNITY): Payer: Self-pay | Admitting: *Deleted

## 2013-11-10 DIAGNOSIS — O2390 Unspecified genitourinary tract infection in pregnancy, unspecified trimester: Secondary | ICD-10-CM | POA: Diagnosis not present

## 2013-11-10 DIAGNOSIS — N390 Urinary tract infection, site not specified: Secondary | ICD-10-CM | POA: Insufficient documentation

## 2013-11-10 DIAGNOSIS — O8620 Urinary tract infection following delivery, unspecified: Secondary | ICD-10-CM

## 2013-11-10 LAB — CBC
HCT: 36.6 % (ref 36.0–46.0)
Hemoglobin: 12.3 g/dL (ref 12.0–15.0)
MCH: 30.2 pg (ref 26.0–34.0)
MCHC: 33.6 g/dL (ref 30.0–36.0)
MCV: 89.9 fL (ref 78.0–100.0)
Platelets: 271 10*3/uL (ref 150–400)
RBC: 4.07 MIL/uL (ref 3.87–5.11)
RDW: 12.9 % (ref 11.5–15.5)
WBC: 6.6 10*3/uL (ref 4.0–10.5)

## 2013-11-10 LAB — URINALYSIS, ROUTINE W REFLEX MICROSCOPIC
Bilirubin Urine: NEGATIVE
Glucose, UA: NEGATIVE mg/dL
Ketones, ur: NEGATIVE mg/dL
Nitrite: NEGATIVE
PROTEIN: NEGATIVE mg/dL
Specific Gravity, Urine: >1.03 — ABNORMAL HIGH (ref 1.005–1.030)
UROBILINOGEN UA: 0.2 mg/dL (ref 0.0–1.0)
pH: 6 (ref 5.0–8.0)

## 2013-11-10 LAB — URINE MICROSCOPIC-ADD ON

## 2013-11-10 MED ORDER — NITROFURANTOIN MONOHYD MACRO 100 MG PO CAPS: 100.0000 mg | ORAL_CAPSULE | Freq: Two times a day (BID) | ORAL | Status: DC

## 2013-11-10 NOTE — MAU Provider Note (Signed)
Chief Complaint: No chief complaint on file.   First Provider Initiated Contact with Patient 11/10/13 2106     SUBJECTIVE HPI: Tiffany Carter is a 33 y.o. [id number] at 13 day S/P C/S who presents with possible UTI. Reports 2 episodes of left flank pain last night and tonight, 8/10 on pain scale at worst, but spontaneous improved to 4/10. Reports dysuria, mild nausea today. No concerns about incision. Scant lochia. Has not resumed intercourse.  Describes pain as constant, sharp focal.   Past Medical History  Diagnosis Date  . Ectopic pregnancy 2014  . Headache(784.0)     history of migraines over 15 years ago  . Tendonitis     left  . Rotator cuff injury     right  . PONV (postoperative nausea and vomiting)    OB History  Gravida Para Term Preterm AB SAB TAB Ectopic Multiple Living  5 2 2  2 1  1  2     # Outcome Date GA Lbr Len/2nd Weight Sex Delivery Anes PTL Lv  5 TRM 10/28/13 [redacted]w[redacted]d  3.295 kg (7 lb 4.2 oz) M LTCS Spinal  Y  4 ECT 2014          3 SAB 2013          2 TRM 05/25/10    F LTCS EPI  Y  1 GRA              Past Surgical History  Procedure Laterality Date  . Appendectomy    . Cesarean section    . Cesarean section with bilateral tubal ligation Bilateral 10/28/2013    Procedure: REPEAT CESAREAN SECTION WITH BILATERAL TUBAL LIGATION;  Surgeon: Linda Hedges, DO;  Location: [facility];  Service: Obstetrics;  Laterality: Bilateral;  edc 11/04/13   History   Social History  . Marital Status: Married    Spouse Name: N/A    Number of Children: N/A  . Years of Education: N/A   Occupational History  . Not on file.   Social History Main Topics  . Smoking status: Never Smoker   . Smokeless tobacco: Never Used  . Alcohol Use: Yes     Comment: socially  . Drug Use: No  . Sexual Activity: Yes    Birth Control/ Protection: None   Other Topics Concern  . Not on file   Social History Narrative  . No narrative on file   No current facility-administered medications on  file prior to encounter.   Current Outpatient Prescriptions on File Prior to Encounter  Medication Sig Dispense Refill  . cholecalciferol (VITAMIN D) 1000 UNITS tablet Take 1,000 Units by mouth daily as needed. For better immunity      . HYDROcodone-acetaminophen (NORCO/VICODIN) 5-325 MG per tablet Take 1-2 tablets by mouth every 4 (four) hours as needed for moderate pain.  30 tablet  0  . ibuprofen (ADVIL,MOTRIN) 600 MG tablet Take 1 tablet (600 mg total) by mouth every 6 (six) hours.  30 tablet  1  . Prenatal Vit-Fe Fumarate-FA (PRENATAL MULTIVITAMIN) TABS tablet Take 1 tablet by mouth daily at 12 noon.       Allergies  Allergen Reactions  . Percocet [Oxycodone-Acetaminophen] Other (See Comments)    Loses Memory Can tolerate Vicodin  . Penicillins Hives    ROS: Pertinent items in HPI. Neg for fever, chills, frequency, urgency, vomiting, diarrhea, constipation or foul-smelling lochia.  OBJECTIVE Blood pressure 126/82, pulse 57, temperature 98.1 F (36.7 C), temperature source Oral, resp. rate 18, height 5'  4" (1.626 m), weight 62.506 kg (137 lb 12.8 oz), last menstrual period 01/28/2013, currently breastfeeding. GENERAL: Well-developed, well-nourished female in no acute distress.  HEENT: Normocephalic HEART: normal rate RESP: normal effort ABDOMEN: Soft, Mild tenderness at incision. Incision are intact. Steri-Strips in place. Questionable mild right CVAT. EXTREMITIES: Nontender, no edema NEURO: Alert and oriented SPECULUM EXAM: Deferred  LAB RESULTS Results for orders placed during the hospital encounter of 11/10/13 (from the past 24 hour(s))  URINALYSIS, ROUTINE W REFLEX MICROSCOPIC     Status: Abnormal   Collection Time    11/10/13  8:12 PM      Result Value Ref Range   Color, Urine YELLOW  YELLOW   APPearance CLEAR  CLEAR   Specific Gravity, Urine >1.030 (*) 1.005 - 1.030   pH 6.0  5.0 - 8.0   Glucose, UA NEGATIVE  NEGATIVE mg/dL   Hgb urine dipstick LARGE (*) NEGATIVE    Bilirubin Urine NEGATIVE  NEGATIVE   Ketones, ur NEGATIVE  NEGATIVE mg/dL   Protein, ur NEGATIVE  NEGATIVE mg/dL   Urobilinogen, UA 0.2  0.0 - 1.0 mg/dL   Nitrite NEGATIVE  NEGATIVE   Leukocytes, UA SMALL (*) NEGATIVE  URINE MICROSCOPIC-ADD ON     Status: None   Collection Time    11/10/13  8:12 PM      Result Value Ref Range   Squamous Epithelial / LPF RARE  RARE   WBC, UA 0-2  <3 WBC/hpf   RBC / HPF 21-50  <3 RBC/hpf   Bacteria, UA RARE  RARE  CBC     Status: None   Collection Time    11/10/13  8:31 PM      Result Value Ref Range   WBC 6.6  4.0 - 10.5 K/uL   RBC 4.07  3.87 - 5.11 MIL/uL   Hemoglobin 12.3  12.0 - 15.0 g/dL   HCT 36.6  36.0 - 46.0 %   MCV 89.9  78.0 - 100.0 fL   MCH 30.2  26.0 - 34.0 pg   MCHC 33.6  30.0 - 36.0 g/dL   RDW 12.9  11.5 - 15.5 %   Platelets 271  150 - 400 K/uL    IMAGING No results found.  MAU COURSE Discussed patient's symptoms exam and labs with Dr. Julien Girt. Will treat as UTI. Urine culture sent.  ASSESSMENT 1. Postpartum UTI    PLAN Discharge home in stable condition per consult with Dr. Julien Girt. Urine culture pending. Push fluids. Pyelonephritis precautions. Follow-up Information   Follow up with [facility], [place].. (2-3 days if no improvement)    Contact information:   [address] [place] [address] [phone]      Follow up with [facility]. (As needed in emergencies)    Contact information:   [address] [id number] [place] [address] [phone]        Medication List    TAKE these medications       cholecalciferol 1000 UNITS tablet  Commonly known as:  VITAMIN D  Take 1,000 Units by mouth daily as needed. For better immunity     HYDROcodone-acetaminophen 5-325 MG per tablet  Commonly known as:  NORCO/VICODIN  Take 1-2 tablets by mouth every 4 (four) hours as needed for moderate pain.     ibuprofen 600  MG tablet  Commonly known as:  ADVIL,MOTRIN  Take 1 tablet (600 mg total) by mouth every 6 (six) hours.  nitrofurantoin (macrocrystal-monohydrate) 100 MG capsule  Commonly known as:  MACROBID  Take 1 capsule (100 mg total) by mouth 2 (two) times daily.     prenatal multivitamin Tabs tablet  Take 1 tablet by mouth daily at 12 noon.      ASK your doctor about these medications       shark liver oil-cocoa butter 0.25-3-85.5 % suppository  Commonly known as:  PREPARATION H  Place 1 suppository rectally as needed for hemorrhoids.     witch hazel-glycerin pad  Commonly known as:  TUCKS  Apply 1 application topically as needed for itching.       [place], [place] 11/10/2013  8:26 PM

## 2013-11-10 NOTE — MAU Note (Signed)
Pt delivered May 27th via c-section. Thinks she may have UTI. States burning with urination and abdominal pain. C/o left flank pain. Denies fever, chills, but has some nausea.

## 2013-11-10 NOTE — Discharge Instructions (Signed)
Urinary Tract Infection Urinary tract infections (UTIs) can develop anywhere along your urinary tract. Your urinary tract is your body's drainage system for removing wastes and extra water. Your urinary tract includes two kidneys, two ureters, a bladder, and a urethra. Your kidneys are a pair of bean-shaped organs. Each kidney is about the size of your fist. They are located below your ribs, one on each side of your spine. CAUSES Infections are caused by microbes, which are microscopic organisms, including fungi, viruses, and bacteria. These organisms are so small that they can only be seen through a microscope. Bacteria are the microbes that most commonly cause UTIs. SYMPTOMS  Symptoms of UTIs may vary by age and gender of the patient and by the location of the infection. Symptoms in young women typically include a frequent and intense urge to urinate and a painful, burning feeling in the bladder or urethra during urination. Older women and men are more likely to be tired, shaky, and weak and have muscle aches and abdominal pain. A fever may mean the infection is in your kidneys. Other symptoms of a kidney infection include pain in your back or sides below the ribs, nausea, and vomiting. DIAGNOSIS To diagnose a UTI, your caregiver will ask you about your symptoms. Your caregiver also will ask to provide a urine sample. The urine sample will be tested for bacteria and white blood cells. White blood cells are made by your body to help fight infection. TREATMENT  Typically, UTIs can be treated with medication. Because most UTIs are caused by a bacterial infection, they usually can be treated with the use of antibiotics. The choice of antibiotic and length of treatment depend on your symptoms and the type of bacteria causing your infection. HOME CARE INSTRUCTIONS  If you were prescribed antibiotics, take them exactly as your caregiver instructs you. Finish the medication even if you feel better after you  have only taken some of the medication.  Drink enough water and fluids to keep your urine clear or pale yellow.  Avoid caffeine, tea, and carbonated beverages. They tend to irritate your bladder.  Empty your bladder often. Avoid holding urine for long periods of time.  Empty your bladder before and after sexual intercourse.  After a bowel movement, women should cleanse from front to back. Use each tissue only once. SEEK MEDICAL CARE IF:   You have back pain.  You develop a fever.  Your symptoms do not begin to resolve within 3 days. SEEK IMMEDIATE MEDICAL CARE IF:   You have severe back pain or lower abdominal pain.  You develop chills.  You have nausea or vomiting.  You have continued burning or discomfort with urination. MAKE SURE YOU:   Understand these instructions.  Will watch your condition.  Will get help right away if you are not doing well or get worse. Document Released: 02/28/2005 Document Revised: 11/20/2011 Document Reviewed: 06/29/2011 [facility] Patient Information 2014 [place].  Pyelonephritis, Adult Pyelonephritis is a kidney infection. In general, there are 2 main types of pyelonephritis:  Infections that come on quickly without any warning (acute pyelonephritis).  Infections that persist for a long period of time (chronic pyelonephritis). CAUSES  Two main causes of pyelonephritis are:  Bacteria traveling from the bladder to the kidney. This is a problem especially in pregnant women. The urine in the bladder can become filled with bacteria from multiple causes, including:  Inflammation of the prostate gland (prostatitis).  Sexual intercourse in females.  Bladder infection (cystitis).  Bacteria traveling from the bloodstream to the tissue part of the kidney. Problems that may increase your risk of getting a kidney infection include:  Diabetes.  Kidney stones or bladder stones.  Cancer.  Catheters placed in the bladder.  Other  abnormalities of the kidney or ureter. SYMPTOMS   Abdominal pain.  Pain in the side or flank area.  Fever.  Chills.  Upset stomach.  Blood in the urine (dark urine).  Frequent urination.  Strong or persistent urge to urinate.  Burning or stinging when urinating. DIAGNOSIS  Your caregiver may diagnose your kidney infection based on your symptoms. A urine sample may also be taken. TREATMENT  In general, treatment depends on how severe the infection is.   If the infection is mild and caught early, your caregiver may treat you with oral antibiotics and send you home.  If the infection is more severe, the bacteria may have gotten into the bloodstream. This will require intravenous (IV) antibiotics and a hospital stay. Symptoms may include:  High fever.  Severe flank pain.  Shaking chills.  Even after a hospital stay, your caregiver may require you to be on oral antibiotics for a period of time.  Other treatments may be required depending upon the cause of the infection. HOME CARE INSTRUCTIONS   Take your antibiotics as directed. Finish them even if you start to feel better.  Make an appointment to have your urine checked to make sure the infection is gone.  Drink enough fluids to keep your urine clear or pale yellow.  Take medicines for the bladder if you have urgency and frequency of urination as directed by your caregiver. SEEK IMMEDIATE MEDICAL CARE IF:   You have a fever or persistent symptoms for more than 2-3 days.  You have a fever and your symptoms suddenly get worse.  You are unable to take your antibiotics or fluids.  You develop shaking chills.  You experience extreme weakness or fainting.  There is no improvement after 2 days of treatment. MAKE SURE YOU:  Understand these instructions.  Will watch your condition.  Will get help right away if you are not doing well or get worse. Document Released: 05/21/2005 Document Revised: 11/20/2011  Document Reviewed: 10/25/2010 [facility] Patient Information 2014 [place], [place].

## 2013-11-12 LAB — URINE CULTURE
Colony Count: >=100000
SPECIAL REQUESTS: NORMAL

## 2014-04-05 ENCOUNTER — Encounter (HOSPITAL_COMMUNITY): Payer: Self-pay | Admitting: *Deleted

## 2015-06-05 HISTORY — PX: COLONOSCOPY: SHX174

## 2015-08-08 ENCOUNTER — Other Ambulatory Visit: Payer: Self-pay | Admitting: Family Medicine

## 2015-08-08 DIAGNOSIS — N6320 Unspecified lump in the left breast, unspecified quadrant: Secondary | ICD-10-CM

## 2015-08-09 ENCOUNTER — Ambulatory Visit
Admission: RE | Admit: 2015-08-09 | Discharge: 2015-08-09 | Disposition: A | Discharge status: Home / Self Care | Payer: No Typology Code available for payment source | Source: Ambulatory Visit | Attending: Family Medicine | Admitting: Family Medicine

## 2015-08-09 ENCOUNTER — Other Ambulatory Visit: Payer: Self-pay | Admitting: Family Medicine

## 2015-08-09 DIAGNOSIS — N6320 Unspecified lump in the left breast, unspecified quadrant: Secondary | ICD-10-CM

## 2015-08-09 DIAGNOSIS — N6310 Unspecified lump in the right breast, unspecified quadrant: Secondary | ICD-10-CM

## 2015-08-11 ENCOUNTER — Other Ambulatory Visit: Payer: Self-pay

## 2016-06-04 HISTORY — PX: OTHER SURGICAL HISTORY: SHX169

## 2016-12-25 ENCOUNTER — Telehealth: Payer: Self-pay

## 2016-12-25 ENCOUNTER — Encounter (HOSPITAL_COMMUNITY): Payer: Self-pay | Admitting: Emergency Medicine

## 2016-12-25 ENCOUNTER — Emergency Department (HOSPITAL_COMMUNITY): Payer: BLUE CROSS/BLUE SHIELD

## 2016-12-25 ENCOUNTER — Emergency Department (HOSPITAL_COMMUNITY)
Admission: EM | Admit: 2016-12-25 | Discharge: 2016-12-25 | Disposition: A | Discharge status: Home / Self Care | Payer: BLUE CROSS/BLUE SHIELD | Attending: Emergency Medicine | Admitting: Emergency Medicine

## 2016-12-25 DIAGNOSIS — R05 Cough: Secondary | ICD-10-CM

## 2016-12-25 DIAGNOSIS — Z791 Long term (current) use of non-steroidal anti-inflammatories (NSAID): Secondary | ICD-10-CM | POA: Diagnosis not present

## 2016-12-25 DIAGNOSIS — R911 Solitary pulmonary nodule: Secondary | ICD-10-CM | POA: Diagnosis not present

## 2016-12-25 DIAGNOSIS — R0602 Shortness of breath: Secondary | ICD-10-CM | POA: Diagnosis not present

## 2016-12-25 DIAGNOSIS — Z79899 Other long term (current) drug therapy: Secondary | ICD-10-CM | POA: Insufficient documentation

## 2016-12-25 DIAGNOSIS — R059 Cough, unspecified: Secondary | ICD-10-CM

## 2016-12-25 LAB — CBC
HCT: 35.4 % — ABNORMAL LOW (ref 36.0–46.0)
HEMOGLOBIN: 11.4 g/dL — AB (ref 12.0–15.0)
MCH: 25.6 pg — ABNORMAL LOW (ref 26.0–34.0)
MCHC: 32.2 g/dL (ref 30.0–36.0)
MCV: 79.4 fL (ref 78.0–100.0)
Platelets: 274 10*3/uL (ref 150–400)
RBC: 4.46 MIL/uL (ref 3.87–5.11)
RDW: 16.7 % — ABNORMAL HIGH (ref 11.5–15.5)
WBC: 5.3 10*3/uL (ref 4.0–10.5)

## 2016-12-25 LAB — BASIC METABOLIC PANEL
ANION GAP: 8 (ref 5–15)
BUN: 7 mg/dL (ref 6–20)
CALCIUM: 9.4 mg/dL (ref 8.9–10.3)
CO2: 25 mmol/L (ref 22–32)
Chloride: 106 mmol/L (ref 101–111)
Creatinine, Ser: 0.75 mg/dL (ref 0.44–1.00)
GFR calc non Af Amer: >60 mL/min (ref >60)
GLUCOSE: 107 mg/dL — AB (ref 65–99)
POTASSIUM: 4.4 mmol/L (ref 3.5–5.1)
Sodium: 139 mmol/L (ref 135–145)

## 2016-12-25 LAB — I-STAT TROPONIN, ED: TROPONIN I, POC: 0 ng/mL (ref 0.00–0.08)

## 2016-12-25 NOTE — ED Provider Notes (Signed)
[place] [facility] DEPT Provider Note   CSN: [id number] Arrival date & time: 12/25/16  0725     History   Chief Complaint Chief Complaint  Patient presents with  . Cough  . Chest Pain  . Shoulder Pain  . Shortness of Breath    HPI Tiffany Carter is a 36 y.o. female.  Patient with family history of colon cancer or breast cancer presents with complaint of daily cough and intermittent shortness of breath ongoing for approximately one year. She has occasional chest tightness and left shoulder pain, shoulder pain which is worsened with movement of the left arm. Shortness of breath described as feeling a she cannot get a full breath in when taking a deep breath. No significant pleuritic pain. Patient denies any fevers, URI symptoms. She does have seasonal allergies and occasional heartburn. No history of asthma or wheezing. Patient was seen at a hospital recently and had a negative chest x-ray and was placed on azithromycin which temporarily helped. She is concerned about cancer. No history of exotic travel, unexplained fevers, night sweats, hemoptysis. The onset of this condition was acute. The course is constant. Aggravating factors: none. Alleviating factors: none. Patient denies risk factors for pulmonary embolism including: unilateral leg swelling, history of DVT/PE/other blood clots, use of exogenous hormones, recent immobilizations, recent surgery, recent travel (>4hr segment), malignancy, hemoptysis.         Past Medical History:  Diagnosis Date  . Ectopic pregnancy 2014  . Headache(784.0)    history of migraines over 15 years ago  . PONV (postoperative nausea and vomiting)   . Rotator cuff injury    right  . Tendonitis    left    Patient Active Problem List   Diagnosis Date Noted  . S/P cesarean section 10/28/2013    Past Surgical History:  Procedure Laterality Date  . APPENDECTOMY    . CESAREAN SECTION    . CESAREAN SECTION WITH BILATERAL TUBAL LIGATION Bilateral  10/28/2013   Procedure: REPEAT CESAREAN SECTION WITH BILATERAL TUBAL LIGATION;  Surgeon: Linda Hedges, DO;  Location: [facility];  Service: Obstetrics;  Laterality: Bilateral;  edc 11/04/13    OB History    Gravida Para Term Preterm AB Living   5 2 2   2 2    SAB TAB Ectopic Multiple Live Births   1   1   2        Home Medications    Prior to Admission medications   Medication Sig Start Date End Date Taking? Authorizing Provider  cholecalciferol (VITAMIN D) 1000 UNITS tablet Take 1,000 Units by mouth daily as needed. For better immunity   Yes [provider]  ibuprofen (ADVIL,MOTRIN) 600 MG tablet Take 1 tablet (600 mg total) by mouth every 6 (six) hours. 10/30/13  Yes Juanda Chance, NP  Multiple Vitamins-Minerals (WOMENS 50+ [place] VITAMIN/MIN) TABS Take 1 tablet by mouth daily.   Yes [provider]  HYDROcodone-acetaminophen (NORCO/VICODIN) 5-325 MG per tablet Take 1-2 tablets by mouth every 4 (four) hours as needed for moderate pain. Patient not taking: Reported on 12/25/2016 10/30/13   Juanda Chance, NP  nitrofurantoin, macrocrystal-monohydrate, (MACROBID) 100 MG capsule Take 1 capsule (100 mg total) by mouth 2 (two) times daily. Patient not taking: Reported on 12/25/2016 11/10/13   Manya Silvas, CNM    Family History Family History  Problem Relation Age of Onset  . Cancer Mother     Social History Social History  Substance Use Topics  . Smoking status: Never Smoker  . Smokeless  tobacco: Never Used  . Alcohol use Yes     Comment: socially     Allergies   Percocet [oxycodone-acetaminophen] and Penicillins   Review of Systems Review of Systems  Constitutional: Negative for fever.  HENT: Negative for rhinorrhea and sore throat.   Eyes: Negative for redness.  Respiratory: Positive for cough, chest tightness and shortness of breath.   Cardiovascular: Negative for chest pain.  Gastrointestinal: Negative for abdominal pain, diarrhea, nausea and vomiting.    Genitourinary: Negative for dysuria.  Musculoskeletal: Positive for arthralgias. Negative for myalgias.  Skin: Negative for rash.  Neurological: Negative for headaches.     Physical Exam Updated Vital Signs BP 116/76   Pulse 78   Temp 98.4 F (36.9 C) (Oral)   Resp 12   Ht 5\' 4"  (1.626 m)   Wt 63.5 kg (140 lb)   LMP 12/24/2016   SpO2 100%   BMI 24.03 kg/m   Physical Exam  Constitutional: She appears well-developed and well-nourished.  HENT:  Head: Normocephalic and atraumatic.  Mouth/Throat: Oropharynx is clear and moist.  Eyes: Conjunctivae are normal. Right eye exhibits no discharge. Left eye exhibits no discharge.  Neck: Normal range of motion. Neck supple.  Cardiovascular: Normal rate, regular rhythm and normal heart sounds.   No murmur heard. Pulmonary/Chest: Effort normal and breath sounds normal.  Abdominal: Soft. There is no tenderness. There is no rebound and no guarding.  Neurological: She is alert.  Skin: Skin is warm and dry.  Psychiatric: She has a normal mood and affect.  Nursing note and vitals reviewed.    ED Treatments / Results  Labs (all labs ordered are listed, but only abnormal results are displayed) Labs Reviewed  BASIC METABOLIC PANEL - Abnormal; Notable for the following:       Result Value   Glucose, Bld 107 (*)    All other components within normal limits  CBC - Abnormal; Notable for the following:    Hemoglobin 11.4 (*)    HCT 35.4 (*)    MCH 25.6 (*)    RDW 16.7 (*)    All other components within normal limits  I-STAT TROPONIN, ED    EKG  EKG Interpretation  Date/Time:  Tuesday December 25 2016 07:30:35 EDT Ventricular Rate:  102 PR Interval:  146 QRS Duration: 78 QT Interval:  366 QTC Calculation: 477 R Axis:   77 Text Interpretation:  Sinus tachycardia Cannot rule out Anterior infarct , age undetermined No old tracing to compare Reconfirmed by Duffy Bruce [phone]) on 12/25/2016 8:51:44 AM       Radiology Dg Chest 2  View  Result Date: 12/25/2016 CLINICAL DATA:  Chest pain EXAM: CHEST  2 VIEW COMPARISON:  None in PACs FINDINGS: The lungs are well-expanded. There is no pneumothorax or pleural effusion. There is an approximately 3 mm diameter calcified nodule in the left pulmonary apex with a slightly smaller nodule in the right apex. There is no infiltrate or atelectasis. The heart and mediastinal structures are normal. The bony thorax exhibits no acute abnormality. IMPRESSION: There is no acute cardiopulmonary abnormality. There are findings that suggest previous granulomatous infection. If the patient's symptoms persist and remain unexplained, CT scanning may be a useful next imaging step. Electronically Signed   By: David  Martinique M.D.   On: 12/25/2016 08:02    Procedures Procedures (including critical care time)  Medications Ordered in ED Medications - No data to display   Initial Impression / Assessment and Plan / ED Course  I have reviewed the triage vital signs and the nursing notes.  Pertinent labs & imaging results that were available during my care of the patient were reviewed by me and considered in my medical decision making (see chart for details).     Patient seen and examined. Workup reviewed with patient. We discussed pulmonary nodules noted on x-ray.  Vital signs reviewed and are as follows: BP 116/84   Pulse 78   Temp 98.1 F (36.7 C) (Oral)   Resp (!) 8   Ht 5\' 4"  (1.626 m)   Wt 63.5 kg (140 lb)   LMP 12/24/2016   SpO2 97%   BMI 24.03 kg/m   Patient is anxious. Offered CT of the chest today to further evaluate the nodules and discussed the risks and benefits of CT. She is concerned about the radiation exposure because of her family history. I also offered referral to pulmonary where she can be worked up for these issues in a more methodical manner. She would prefer to do this. I think this is reasonable.  We discussed that she does not have any concerning risk factors or  findings today for life threatening or dangerous illness but she is encouraged to return if she develops worsening shortness of breath, chest pains that are persistent, fever, or new concerning symptoms. She verbalizes understanding and agrees with plan. Will give referral to pulmonology.     Final Clinical Impressions(s) / ED Diagnoses   Final diagnoses:  Cough  Shortness of breath  Pulmonary nodule   Patient with chronic cough and subjective shortness of breath over the past 1 year. She does not have any risk factors for PE. X-ray shows incidental small pulmonary nodules. No risk factors for TB and history does not really suggest PE. Workup here reassuring. Patient will follow-up with pulmonary for further evaluation.   New Prescriptions New Prescriptions   No medications on file     Carlisle Cater, Hershal Coria 12/25/16 5643    Duffy Bruce, MD 12/25/16 [phone]

## 2016-12-25 NOTE — Discharge Instructions (Signed)
Please read and follow all provided instructions.  Your diagnoses today include:  1. Cough   2. Shortness of breath   3. Pulmonary nodule     Tests performed today include:  Chest x-ray - shows two very small nodules, no pneumonia  Blood counts and electrolytes - no problems  Vital signs. See below for your results today.   Medications prescribed:   None  Take any prescribed medications only as directed.  Home care instructions:  Follow any educational materials contained in this packet.  Follow-up instructions: Please follow-up with the lung doctor listed for evaluation of your nodules and chronic cough.   Return instructions:   Please return to the Emergency Department if you experience worsening symptoms.   Return with fever, worsening chest pain or persistent shortness of breath  Please return if you have any other emergent concerns.  Additional Information:  Your vital signs today were: BP 116/76    Pulse 78    Temp 98.4 F (36.9 C) (Oral)    Resp 12    Ht 5\' 4"  (1.626 m)    Wt 63.5 kg (140 lb)    LMP 12/24/2016    SpO2 100%    BMI 24.03 kg/m  If your blood pressure (BP) was elevated above 135/85 this visit, please have this repeated by your doctor within one month. --------------

## 2016-12-25 NOTE — Telephone Encounter (Addendum)
lmtcb x1 for pt. Per Magda Paganini, okay to schedule in a 15 min slot with MW.

## 2016-12-25 NOTE — ED Triage Notes (Signed)
Pt. Stated, I've had a cough for over a year and recently I've had some chest pain and both shoulder are hurting.  And when I take a breath , its like I can't get a whole breath.

## 2016-12-26 NOTE — Telephone Encounter (Signed)
lmtcb x2 for pt. 

## 2016-12-27 ENCOUNTER — Ambulatory Visit (INDEPENDENT_AMBULATORY_CARE_PROVIDER_SITE_OTHER): Payer: BLUE CROSS/BLUE SHIELD | Admitting: Internal Medicine

## 2016-12-27 ENCOUNTER — Encounter: Payer: Self-pay | Admitting: Internal Medicine

## 2016-12-27 ENCOUNTER — Other Ambulatory Visit (INDEPENDENT_AMBULATORY_CARE_PROVIDER_SITE_OTHER): Payer: BLUE CROSS/BLUE SHIELD

## 2016-12-27 VITALS — BP 112/80 | HR 89 | Ht 64.0 in | Wt 137.0 lb

## 2016-12-27 DIAGNOSIS — R058 Other specified cough: Secondary | ICD-10-CM

## 2016-12-27 DIAGNOSIS — R918 Other nonspecific abnormal finding of lung field: Secondary | ICD-10-CM | POA: Diagnosis not present

## 2016-12-27 DIAGNOSIS — R05 Cough: Secondary | ICD-10-CM

## 2016-12-27 LAB — CBC WITH DIFFERENTIAL/PLATELET
Basophils Absolute: 0 10*3/uL (ref 0.0–0.1)
Basophils Relative: 0.9 % (ref 0.0–3.0)
Eosinophils Absolute: 0.2 10*3/uL (ref 0.0–0.7)
Eosinophils Relative: 4.9 % (ref 0.0–5.0)
HEMATOCRIT: 36.3 % (ref 36.0–46.0)
Hemoglobin: 11.9 g/dL — ABNORMAL LOW (ref 12.0–15.0)
LYMPHS PCT: 31.6 % (ref 12.0–46.0)
Lymphs Abs: 1.6 10*3/uL (ref 0.7–4.0)
MCHC: 32.6 g/dL (ref 30.0–36.0)
MCV: 80.9 fl (ref 78.0–100.0)
Monocytes Absolute: 0.4 10*3/uL (ref 0.1–1.0)
Monocytes Relative: 8.2 % (ref 3.0–12.0)
NEUTROS ABS: 2.8 10*3/uL (ref 1.4–7.7)
Neutrophils Relative %: 54.4 % (ref 43.0–77.0)
PLATELETS: 269 10*3/uL (ref 150.0–400.0)
RBC: 4.49 Mil/uL (ref 3.87–5.11)
RDW: 17.5 % — ABNORMAL HIGH (ref 11.5–15.5)
WBC: 5.1 10*3/uL (ref 4.0–10.5)

## 2016-12-27 LAB — TSH: TSH: 2.5 u[IU]/mL (ref 0.35–4.50)

## 2016-12-27 LAB — D-DIMER, QUANTITATIVE: D-Dimer, Quant: 0.41 mcg/mL FEU (ref <0.50)

## 2016-12-27 MED ORDER — BENZONATATE 200 MG PO CAPS: 200.0000 mg | ORAL_CAPSULE | Freq: Three times a day (TID) | ORAL | 1 refills | Status: DC | PRN

## 2016-12-27 MED ORDER — PANTOPRAZOLE SODIUM 40 MG PO TBEC: 40.0000 mg | DELAYED_RELEASE_TABLET | Freq: Every day | ORAL | 2 refills | Status: DC

## 2016-12-27 MED ORDER — FAMOTIDINE 20 MG PO TABS: ORAL_TABLET | ORAL | 2 refills | Status: DC

## 2016-12-27 NOTE — Telephone Encounter (Signed)
Pt has been scheduled to see MW on 12/27/2016 at 9:15am. Message will be closed.

## 2016-12-27 NOTE — Assessment & Plan Note (Addendum)
The most common causes of chronic cough in immunocompetent adults include the following: upper airway cough syndrome (UACS), previously referred to as postnasal drip syndrome (PNDS), which is caused by variety of rhinosinus conditions; (2) asthma; (3) GERD; (4) chronic bronchitis from cigarette smoking or other inhaled environmental irritants; (5) nonasthmatic eosinophilic bronchitis; and (6) bronchiectasis.   These conditions, singly or in combination, have accounted for up to 94% of the causes of chronic cough in prospective studies.   Other conditions have constituted no >6% of the causes in prospective studies These have included bronchogenic carcinoma, chronic interstitial pneumonia, sarcoidosis, left ventricular failure, ACEI-induced cough, and aspiration from a condition associated with pharyngeal dysfunction.    Chronic cough is often simultaneously caused by more than one condition. A single cause has been found from 38 to 82% of the time, multiple causes from 18 to 62%. Multiply caused cough has been the result of three diseases up to 42% of the time.       Most likely this is Upper airway cough syndrome (previously labeled PNDS) , is  so named because it's frequently impossible to sort out how much is  CR/sinusitis with freq throat clearing (which can be related to primary GERD)   vs  causing  secondary (" extra esophageal")  GERD from wide swings in gastric pressure that occur with throat clearing, often  promoting self use of mint and menthol lozenges that reduce the lower esophageal sphincter tone and exacerbate the problem further in a cyclical fashion.   These are the same pts (now being labeled as having "irritable larynx syndrome" by some cough centers) who not infrequently have a history of having failed to tolerate ace inhibitors,  dry powder inhalers or biphosphonates or report having atypical/extraesophageal reflux symptoms that don't respond to standard doses of PPI  and are easily  confused as having aecopd or asthma flares by even experienced allergists/ pulmonologists (myself included).   Of the three most common causes of  Sub-acute or recurrent or chronic cough, only one (GERD)  can actually contribute to/ trigger  the other two (asthma and post nasal drip syndrome)  and perpetuate the cylce of cough.  While not intuitively obvious, many patients with chronic low grade reflux do not cough until there is a primary insult that disturbs the protective epithelial barrier and exposes sensitive nerve endings.   This is typically viral but can be direct physical injury such as with an endotracheal tube.   The point is that once this occurs, it is difficult to eliminate the cycle  using anything but a maximally effective acid suppression regimen at least in the short run, accompanied by an appropriate diet to address non acid GERD and control / eliminate the cough itself for at least 3 days.   Try max rx for gerd while waiting on allergy profile and sinus CT and suppress throat clearing with tessalon 200 tid prn   Total time devoted to counseling  > 50 % of initial 60 min office visit:  review case with pt/husband Rodman Key discussion of options/alternatives/ personally creating written customized instructions  in presence of pt  then going over those specific  Instructions directly with the pt including how to use all of the meds but in particular covering each new medication in detail and the difference between the maintenance= "automatic" meds and the prns using an action plan format for the latter (If this problem/symptom => do that organization reading Left to right).  Please see AVS from  this visit for a full list of these instructions which I personally wrote for this pt and  are unique to this visit.

## 2016-12-27 NOTE — Patient Instructions (Addendum)
  GERD (REFLUX)  is an extremely common cause of respiratory symptoms just like yours , many times with no obvious heartburn at all.    It can be treated with medication, but also with lifestyle changes including elevation of the head of your bed (ideally with 6 inch  bed blocks),  Smoking cessation, avoidance of late meals, excessive alcohol, and avoid fatty foods, chocolate, peppermint, colas, red wine, and acidic juices such as orange juice.  NO MINT OR MENTHOL PRODUCTS SO NO COUGH DROPS   USE SUGARLESS CANDY INSTEAD (Jolley ranchers or Stover's or Life Savers) or even ice chips will also do - the key is to swallow to prevent all throat clearing. NO OIL BASED VITAMINS - use powdered substitutes.   For cough > tessalon 200 three times a day as needed  Pantoprazole (protonix) 40 mg   Take  30-60 min before first meal of the day and Pepcid (famotidine)  20 mg one @  bedtime until return to office - this is the best way to tell whether stomach acid is contributing to your problem.  Please see patient coordinator before you leave today  to schedule  Sinus CT in a week   Please remember to go to the lab  department downstairs in the basement  for your tests - we will call you with the results when they are available.   Please schedule a follow up office visit in 4 weeks, sooner if needed        .

## 2016-12-27 NOTE — Progress Notes (Signed)
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  with onset in 2004 sneezing/eyes running march to may developed p arrival at [place] then moved to [facility] in 2012 even worse rhinitis symptoms then feb 2017 with daily daytime dry cough since then but much worse early part of road trip to [place] November 30 2016  much worse cough/ first time had chest tightness then July 8th in [place] also sob/ severe cough and went to [facility] with dx of kidney stone (abd pain not made worse by coughing) rx zpak/ no narcs and [place] and cough much better until gradually worse cough evolving over a week > er 12/25/16 with abn cxr so referred to pulmonary clinic 12/27/2016 by EDP.    12/27/2016 1st  Pulmonary office visit/ Staceyann Knouff   Chief Complaint  Patient presents with  . Pulmonary Consult    Self referral. Pt c/o cough for the past year, worse since end of June 2018- traveled by car to [place] and [place]. She states she has chest tightness that comes and goes. She states she feels as if she can not take in a full, deep breath and when she does it makes her cough. Cough has been mainly non prod, but will occ produce some clear sputum.    despite the cough and chest tightness not worse with ex - pushing stroller uphill fine Sleeping is ok also  Symptoms no better on prednisone    No obvious day to day or daytime variability or assoc excess/ purulent sputum or mucus plugs or hemoptysis or cp or chest tightness, subjective wheeze or overt sinus or hb symptoms. No unusual exp hx or h/o childhood pna/ asthma or knowledge of premature birth.  Sleeping ok without nocturnal  or early am exacerbation  of respiratory  c/o's or need for noct saba. Also denies any obvious fluctuation of symptoms with weather or environmental changes or other aggravating or alleviating factors except as  outlined above   Current Medications, Allergies, Complete Past Medical History, Past Surgical History, Family History, and Social History were reviewed in [employer] record.  ROS  The following are not active complaints unless bolded sore throat, dysphagia, dental problems, itching, sneezing,  nasal congestion or excess/ purulent secretions, ear ache,   fever, chills, sweats, unintended wt loss, classically pleuritic or exertional cp,  orthopnea pnd or leg swelling, presyncope, palpitations, abdominal pain, anorexia, nausea, vomiting, diarrhea  or change in bowel or bladder habits, change in stools or urine, dysuria,hematuria,  rash, arthralgias, visual complaints, headache, numbness, weakness or ataxia or problems with walking or coordination,  change in mood/affect or memory.              Review of Systems     Objective:   Physical Exam Healthy appearing amb wf and   Wt Readings from Last 3 Encounters:  12/27/16 137 lb (62.1 kg)  12/25/16 140 lb (63.5 kg)  11/10/13 137 lb 12.8 oz (62.5 kg)    Vital signs reviewed  - Note on arrival 02 sats  97% on RA     HEENT: nl dentition, turbinates bilaterally, - oropharynx with mucoid pnd/ mild cobblestoning  Nl external ear canals without cough reflex   NECK :  without JVD/Nodes/TM/ nl carotid upstrokes bilaterally   LUNGS: no acc muscle use,  Nl contour chest which is clear to A and P bilaterally without cough on insp or exp maneuvers   CV:  RRR  no s3 or murmur or increase in P2, and no edema   ABD:  soft and nontender with nl inspiratory excursion in the supine position. No bruits or organomegaly appreciated, bowel sounds nl  MS:  Nl gait/ ext warm without deformities, calf tenderness, cyanosis or clubbing L shoulder limited external rotation/ reproduced pain   SKIN: warm and dry without lesions    NEURO:  alert, approp, nl sensorium with  no motor or cerebellar deficits apparent.   Labs ordered/  reviewed:      Chemistry      Component Value Date/Time   NA 139 12/25/2016 0730   K 4.4 12/25/2016 0730   CL 106 12/25/2016 0730   CO2 25 12/25/2016 0730   BUN 7 12/25/2016 0730   CREATININE 0.75 12/25/2016 0730      Component Value Date/Time   CALCIUM 9.4 12/25/2016 0730        Lab Results  Component Value Date   WBC 5.1 12/27/2016   HGB 11.9 (L) 12/27/2016   HCT 36.3 12/27/2016   MCV 80.9 12/27/2016   PLT 269.0 12/27/2016     Lab Results  Component Value Date   DDIMER 0.41 12/27/2016      Lab Results  Component Value Date   TSH 2.50 12/27/2016    Labs ordered 12/27/2016    = allergy profile       I personally reviewed images and agree with radiology impression as follows:  CXR:   12/25/16 There is no acute cardiopulmonary abnormality. There are findings that suggest previous granulomatous infection. If the patient's symptoms persist and remain unexplained, CT scanning may be a useful next imaging step.      Assessment:

## 2016-12-28 ENCOUNTER — Encounter: Payer: Self-pay | Admitting: Internal Medicine

## 2016-12-28 DIAGNOSIS — R918 Other nonspecific abnormal finding of lung field: Secondary | ICD-10-CM | POA: Insufficient documentation

## 2016-12-28 LAB — RESPIRATORY ALLERGY PROFILE REGION II ~~LOC~~
Allergen, A. alternata, m6: <0.1 kU/L
Allergen, C. Herbarum, M2: <0.1 kU/L
Allergen, Cedar tree, t12: 3.41 kU/L — ABNORMAL HIGH
Allergen, D pternoyssinus,d7: <0.1 kU/L
Allergen, Mouse Urine Protein, e78: <0.1 kU/L
Allergen, Mulberry, t76: <0.1 kU/L
Allergen, Oak,t7: <0.1 kU/L
Box Elder IgE: <0.1 kU/L
Cat Dander: <0.1 kU/L
Cockroach: <0.1 kU/L
Common Ragweed: <0.1 kU/L
D. farinae: <0.1 kU/L
Dog Dander: <0.1 kU/L
Elm IgE: <0.1 kU/L
IgE (Immunoglobulin E), Serum: 21 kU/L (ref <115)
Johnson Grass: <0.1 kU/L
Sheep Sorrel IgE: <0.1 kU/L
Timothy Grass: <0.1 kU/L

## 2016-12-28 NOTE — Progress Notes (Signed)
Spoke with pt and notified of results per Dr. Wert. Pt verbalized understanding and denied any questions. 

## 2016-12-28 NOTE — Telephone Encounter (Signed)
Would rec she go ahead and do HRCT chest with the sinus ct already scheduled for 01/03/17- this will prove she doesn't have bornchiectasis / ILD which goes along with sinusits in w/u for chronic cough  But also answer her question re any form of cancer in lung which I very strongly doubt so low dose screening is not a good test to consider for her as it won't answer the relevant clinical question as well as the HRCT will

## 2016-12-28 NOTE — Telephone Encounter (Signed)
MW please advise.  Thanks.  

## 2016-12-28 NOTE — Assessment & Plan Note (Addendum)
Exposure to [place] x 5 years remotely  and cxr c/w several calcified granulomas  Discussed in detail all the  indications, usual  risks and alternatives  relative to the benefits with patient who agrees to proceed with conservative f/u > only need CT chest if resp symptoms persist

## 2016-12-31 NOTE — Telephone Encounter (Signed)
MW please see additional message from pt.  Thanks.  ---------------- Thank you - I feel comfortable with a sinus only CT scan for now - the cough is disappearing quickly with the medications and diet changes and I am feeling remarkably better!

## 2016-12-31 NOTE — Telephone Encounter (Signed)
MW please advise on additional 2 emails:  ----------------------  I know I'm a nightmare patient... I am worried about a HRCT chest scan at age 36 with the radiation - is there an effective alternative, such as ultrasound or MRI? If Dr. Melvyn Novas thinks HRCT is the best option long-term, I will go with that, but I'm concerned about radiation, given my strong family history of cancer. I'm driving myself crazy and am so sorry to be a tough patient!  ------ I apologize for the multiple messages. If MRI, ultrasound or the blood test I linked to are not options, I think I'd rather hold off on the HRCT scan and see if we can clear this up, before going that direction. I'm just too worried about the radiation risk! Thank you for answering my questions so promptly, and for the reassurance!  ------------------

## 2016-12-31 NOTE — Telephone Encounter (Signed)
That's fine

## 2017-01-03 ENCOUNTER — Ambulatory Visit (INDEPENDENT_AMBULATORY_CARE_PROVIDER_SITE_OTHER)
Admission: RE | Admit: 2017-01-03 | Discharge: 2017-01-03 | Disposition: A | Discharge status: Home / Self Care | Payer: BLUE CROSS/BLUE SHIELD | Source: Ambulatory Visit | Attending: Internal Medicine | Admitting: Internal Medicine

## 2017-01-03 ENCOUNTER — Inpatient Hospital Stay: Payer: No Typology Code available for payment source | Admitting: Internal Medicine

## 2017-01-03 DIAGNOSIS — R05 Cough: Secondary | ICD-10-CM

## 2017-01-03 DIAGNOSIS — R058 Other specified cough: Secondary | ICD-10-CM

## 2017-01-10 ENCOUNTER — Encounter: Payer: Self-pay | Admitting: Internal Medicine

## 2017-02-01 ENCOUNTER — Ambulatory Visit (INDEPENDENT_AMBULATORY_CARE_PROVIDER_SITE_OTHER): Payer: BLUE CROSS/BLUE SHIELD | Admitting: Internal Medicine

## 2017-02-01 ENCOUNTER — Encounter: Payer: Self-pay | Admitting: Internal Medicine

## 2017-02-01 VITALS — BP 122/78 | HR 76 | Ht 64.0 in | Wt 136.4 lb

## 2017-02-01 DIAGNOSIS — R05 Cough: Secondary | ICD-10-CM

## 2017-02-01 DIAGNOSIS — Z23 Encounter for immunization: Secondary | ICD-10-CM | POA: Diagnosis not present

## 2017-02-01 DIAGNOSIS — R918 Other nonspecific abnormal finding of lung field: Secondary | ICD-10-CM | POA: Diagnosis not present

## 2017-02-01 DIAGNOSIS — R058 Other specified cough: Secondary | ICD-10-CM

## 2017-02-01 NOTE — Patient Instructions (Signed)
For drainage / throat tickle try take CHLORPHENIRAMINE  4 mg - take one every 4 hours as needed - available over the counter- may cause drowsiness so start with just a bedtime dose or two and see how you tolerate it before trying in daytime  If not effective for the am sore throat, add back pepcid 20 mg one at bedtime   If not happy, let me refer you to an allergist   In the event of a severe cough >  Delsym cough syrup, prilosec = pantoprazole Take 30-60 min before first meal of the day and pecid 20 mg at bedtime

## 2017-02-01 NOTE — Progress Notes (Signed)
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  with onset in 2004 sneezing/eyes running march to [place] developed p arrival at [place] then moved to [facility] in [place] even worse rhinitis symptoms then feb 2017 with daily daytime dry cough since then but much worse early part of road trip to [place] November 30 2016  much worse cough/ first time had chest tightness then July 8th in [place] also sob/ severe cough and went to [facility] with dx of kidney stone (abd pain not made worse by coughing) rx zpak/ no narcs and returned on July 15 to [place] and cough much better until gradually worse cough evolving over a week > er 12/25/16 with abn cxr so referred to pulmonary clinic 12/27/2016 by EDP.     History of Present Illness  12/27/2016 1st [facility] office visit/ Radiah Lubinski   Chief Complaint  Patient presents with  . Pulmonary Consult    Self referral. Pt c/o cough for the past year, worse since end of June 2018- traveled by car to [place] and [place]. She states she has chest tightness that comes and goes. She states she feels as if she can not take in a full, deep breath and when she does it makes her cough. Cough has been mainly non prod, but will occ produce some clear sputum.    despite the cough and chest tightness not worse with ex - pushing stroller uphill fine Sleeping is ok also  Symptoms no better on prednisone rec GERD diet  For cough > tessalon 200 three times a day as needed Pantoprazole (protonix) 40 mg   Take  30-60 min before first meal of the day and Pepcid (famotidine)  20 mg one @  bedtime until return to office - this is the best way to tell whether stomach acid is contributing to your problem. Please see patient coordinator before you leave today  to schedule  Sinus CT in a week  Please remember to go to the lab  department downstairs in the basement   for your tests - we will call you with the results when they are available.     02/01/2017  f/u ov/Tajee Savant re:  Uacs. Cough resolved/ stopped gerd rx  Chief Complaint  Patient presents with  . Follow-up    Pt is doing well overall, and cough is almost gone. Pt is concerned about ED visit in end July a spot was found in each lobe of lungs,. Would like more info on this concern.   viariable sore throat worse in am with some sense of pnds but s excess/ purulent sputum or mucus plugs  Not limited by breathing from desired activities    No obvious patterns day to day or daytime variability or assoc   cp or chest tightness, subjective wheeze or overt sinus or hb symptoms. No unusual exp hx or h/o childhood pna/ asthma or knowledge of premature birth.  Sleeping ok without nocturnal  or early am exacerbation  of respiratory  c/o's or need for noct saba. Also denies any obvious fluctuation of symptoms with weather or environmental changes or other aggravating or alleviating factors except as outlined above   Current Medications, Allergies, Complete Past Medical History, Past Surgical History, Family History, and Social History were reviewed in [employer] record.  ROS  The following are not active complaints unless bolded sore throat, dysphagia, dental problems, itching, sneezing,  nasal congestion or excess/ purulent secretions, ear ache,   fever, chills, sweats, unintended wt loss, classically pleuritic or exertional cp, hemoptysis,  orthopnea pnd or leg swelling, presyncope, palpitations, abdominal pain, anorexia, nausea, vomiting, diarrhea  or change in bowel or bladder habits, change in stools or urine, dysuria,hematuria,  rash, arthralgias, visual complaints, headache, numbness, weakness or ataxia or problems with walking or coordination,  change in mood/affect or memory.                             Objective:   Physical Exam  Healthy appearing pleasant amb  wf and    02/03/2017         136   12/27/16 137 lb (62.1 kg)  12/25/16 140 lb (63.5 kg)  11/10/13 137 lb 12.8 oz (62.5 kg)    Vital signs reviewed  - Note on arrival 02 sats  98% on RA     HEENT: nl dentition, turbinates bilaterally, - oropharynx pristine  Nl external ear canals without cough reflex   NECK :  without JVD/Nodes/TM/ nl carotid upstrokes bilaterally   LUNGS: no acc muscle use,  Nl contour chest which is clear to A and P bilaterally without cough on insp or exp maneuvers   CV:  RRR  no s3 or murmur or increase in P2, and no edema   ABD:  soft and nontender with nl inspiratory excursion in the supine position. No bruits or organomegaly appreciated, bowel sounds nl  MS:  Nl gait/ ext warm without deformities, calf tenderness, cyanosis or clubbing  no joint restrictions  SKIN: warm and dry without lesions    NEURO:  alert, approp, nl sensorium with  no motor or cerebellar deficits apparent.      Labs   reviewed:      Chemistry      Component Value Date/Time   NA 139 12/25/2016 0730   K 4.4 12/25/2016 0730   CL 106 12/25/2016 0730   CO2 25 12/25/2016 0730   BUN 7 12/25/2016 0730   CREATININE 0.75 12/25/2016 0730      Component Value Date/Time   CALCIUM 9.4 12/25/2016 0730        Lab Results  Component Value Date   WBC 5.1 12/27/2016   HGB 11.9 (L) 12/27/2016   HCT 36.3 12/27/2016   MCV 80.9 12/27/2016   PLT 269.0 12/27/2016     Lab Results  Component Value Date   DDIMER 0.41 12/27/2016      Lab Results  Component Value Date   TSH 2.50 12/27/2016         I personally reviewed images and agree with radiology impression as follows:  CXR:   12/25/16 There is no acute cardiopulmonary abnormality. There are findings that suggest previous granulomatous infection.       Assessment:

## 2017-02-03 ENCOUNTER — Encounter: Payer: Self-pay | Admitting: Internal Medicine

## 2017-02-03 NOTE — Assessment & Plan Note (Signed)
Exposure to [employer] x 5 years and cxr c/w several densely calcified granulomas   Reviewed natural hx of "histo in the nl host" and do not rec additional studies unless unexplained cough or other resp symptoms

## 2017-02-03 NOTE — Assessment & Plan Note (Addendum)
Allergy profile 12/27/2016 >  Eos 0.2 /  IgE  21 RAST pos only for [place] - Sinus CT 01/03/2017  consistent with Rhinosinusitis but no a/f levels  - 02/01/2017 rec trial of 1st gen H1 blockers per guidelines  And pepcid 20 mg for sore throat > consider trial of dysmista next   Cough has resolved with no significant allergies or evidence of acute sinusitis so reviewed use of 1st gen H1B and and also ppi short term should cough flare in future eg with uri/ otc's contingencies reviewed> pulmonary f/u prn   I had an extended discussion with the patient reviewing all relevant studies completed to date and  lasting 15 to 20 minutes of a 25 minute visit    Each maintenance medication was reviewed in detail including most importantly the difference between maintenance and prns and under what circumstances the prns are to be triggered using an action plan format that is not reflected in the computer generated alphabetically organized AVS.    Please see AVS for specific instructions unique to this visit that I personally wrote and verbalized to the the pt in detail and then reviewed with pt  by my nurse highlighting any  changes in therapy recommended at today's visit to their plan of care.

## 2017-03-21 ENCOUNTER — Ambulatory Visit
Admission: RE | Admit: 2017-03-21 | Discharge: 2017-03-21 | Disposition: A | Discharge status: Home / Self Care | Payer: BLUE CROSS/BLUE SHIELD | Source: Ambulatory Visit | Attending: Family Medicine | Admitting: Family Medicine

## 2017-03-21 ENCOUNTER — Other Ambulatory Visit: Payer: Self-pay | Admitting: Family Medicine

## 2017-03-21 DIAGNOSIS — R1084 Generalized abdominal pain: Secondary | ICD-10-CM

## 2017-03-21 DIAGNOSIS — R110 Nausea: Secondary | ICD-10-CM

## 2017-04-02 ENCOUNTER — Other Ambulatory Visit: Payer: Self-pay

## 2017-04-02 ENCOUNTER — Encounter (HOSPITAL_COMMUNITY): Payer: Self-pay

## 2017-04-02 ENCOUNTER — Emergency Department (HOSPITAL_COMMUNITY)
Admission: EM | Admit: 2017-04-02 | Discharge: 2017-04-02 | Disposition: A | Discharge status: Home / Self Care | Payer: BLUE CROSS/BLUE SHIELD | Attending: Emergency Medicine | Admitting: Emergency Medicine

## 2017-04-02 DIAGNOSIS — Z79899 Other long term (current) drug therapy: Secondary | ICD-10-CM | POA: Insufficient documentation

## 2017-04-02 DIAGNOSIS — R1033 Periumbilical pain: Secondary | ICD-10-CM | POA: Insufficient documentation

## 2017-04-02 LAB — LIPASE, BLOOD: Lipase: 27 U/L (ref 11–51)

## 2017-04-02 LAB — COMPREHENSIVE METABOLIC PANEL
ALK PHOS: 47 U/L (ref 38–126)
ALT: 19 U/L (ref 14–54)
ANION GAP: 8 (ref 5–15)
AST: 22 U/L (ref 15–41)
Albumin: 4.1 g/dL (ref 3.5–5.0)
BILIRUBIN TOTAL: 0.6 mg/dL (ref 0.3–1.2)
BUN: 8 mg/dL (ref 6–20)
CO2: 24 mmol/L (ref 22–32)
CREATININE: 0.81 mg/dL (ref 0.44–1.00)
Calcium: 9.1 mg/dL (ref 8.9–10.3)
Chloride: 105 mmol/L (ref 101–111)
GFR calc non Af Amer: >60 mL/min (ref >60)
GLUCOSE: 100 mg/dL — AB (ref 65–99)
Potassium: 3.8 mmol/L (ref 3.5–5.1)
SODIUM: 137 mmol/L (ref 135–145)
Total Protein: 7.1 g/dL (ref 6.5–8.1)

## 2017-04-02 LAB — CBC
HCT: 33.8 % — ABNORMAL LOW (ref 36.0–46.0)
Hemoglobin: 10.8 g/dL — ABNORMAL LOW (ref 12.0–15.0)
MCH: 25.1 pg — AB (ref 26.0–34.0)
MCHC: 32 g/dL (ref 30.0–36.0)
MCV: 78.6 fL (ref 78.0–100.0)
PLATELETS: 275 10*3/uL (ref 150–400)
RBC: 4.3 MIL/uL (ref 3.87–5.11)
RDW: 14.5 % (ref 11.5–15.5)
WBC: 7.4 10*3/uL (ref 4.0–10.5)

## 2017-04-02 LAB — I-STAT BETA HCG BLOOD, ED (MC, WL, AP ONLY): I-stat hCG, quantitative: <5 m[IU]/mL (ref <5)

## 2017-04-02 NOTE — ED Notes (Signed)
PT states understanding of care given, follow up care. PT ambulated from ED to car with a steady gait.  

## 2017-04-02 NOTE — ED Notes (Signed)
While being triage pt had LOC for about 30-45 secs; pt was a&ox 4 once she came to; pt states she had episode 10 years ago but nothing recent; Pt roomed to A1

## 2017-04-02 NOTE — ED Triage Notes (Signed)
Pt states she has had severe abdominal pain since the beginning for the month; pt has seen PCP x 2 and dx with constipation; pt states pain increased this morning to burning pain; Pt states she would like to avoid CT or xray due to overexposed; Pt states pain at 8/10 on arrival. Pt a &o x 4; Pt state last BM yesterday was normal.-Monique,RN

## 2017-04-02 NOTE — ED Provider Notes (Signed)
[facility] Provider Note   CSN: [id number] Arrival date & time: 04/02/17  0353     History   Chief Complaint Chief Complaint  Patient presents with  . Abdominal Pain  . Loss of Consciousness    HPI Tiffany Carter is a 36 y.o. female presenting to the ED with 1 month of intermittent periumbilical abdominal pain that acutely worsened overnight.  Patient states she is seen her PCP twice for these symptoms with a negative abdominal x-ray on 03/21/2017, and a negative pelvic/transvaginal ultrasound last week.  She states the pain feels like a pulling behind her bellybutton, that came more of a burning sensation overnight.  He states pain is made with standing and movement, as well as bearing down.  It is improved with rest.  No medications tried for pain.  Denies nausea/vomiting, diarrhea, constipation, blood in stool, urinary symptoms, vaginal bleeding or discharge, fever or chills, heartburn, or any other associated symptoms.  Patient is very concerned about radiation from CT scans, however patient has received 3 total in her life, she reports a CT scan of her head, chest, and abdomen.  Last CT abdomen done in 2014.  Patient also reports family history of her mother with colon cancer at age 35, however she is been getting yearly colonoscopies as recommended with last one done March 2017 and normal. In triage while patient was getting blood drawn, she had a brief syncopal episode.  Patient states this was due to the needle in her arm and the blood coming out of her arm causing her to feel very nervous/anxious.  She states she is at her baseline now without lightheadedness. Past abdominal surgeries include appendectomy and tubal ligation.  The history is provided by the patient.    Past Medical History:  Diagnosis Date  . Ectopic pregnancy 2014  . Headache(784.0)    history of migraines over 15 years ago  . PONV (postoperative nausea and vomiting)     . Rotator cuff injury    right  . Tendonitis    left    Patient Active Problem List   Diagnosis Date Noted  . Abnormal CXR with multiple nodules 12/28/2016  . Upper airway cough syndrome 12/27/2016  . S/P cesarean section 10/28/2013    Past Surgical History:  Procedure Laterality Date  . APPENDECTOMY    . CESAREAN SECTION    . CESAREAN SECTION WITH BILATERAL TUBAL LIGATION Bilateral 10/28/2013   Procedure: REPEAT CESAREAN SECTION WITH BILATERAL TUBAL LIGATION;  Surgeon: Linda Hedges, DO;  Location: [facility];  Service: Obstetrics;  Laterality: Bilateral;  edc 11/04/13    OB History    Gravida Para Term Preterm AB Living   5 2 2   2 2    SAB TAB Ectopic Multiple Live Births   1   1   2        Home Medications    Prior to Admission medications   Medication Sig Start Date End Date Taking? Authorizing Provider  benzonatate (TESSALON) 200 MG capsule Take 1 capsule (200 mg total) by mouth 3 (three) times daily as needed for cough. Patient not taking: Reported on 02/01/2017 12/27/16   Tanda Rockers, MD  famotidine (PEPCID) 20 MG tablet One at bedtime Patient not taking: Reported on 02/01/2017 12/27/16   Tanda Rockers, MD  ibuprofen (ADVIL,MOTRIN) 200 MG tablet Take 200 mg by mouth every 6 (six) hours as needed.    [provider]  Multiple Vitamin (MULTIVITAMIN) capsule Take 1  capsule by mouth daily.    [provider]  pantoprazole (PROTONIX) 40 MG tablet Take 1 tablet (40 mg total) by mouth daily. Take 30-60 min before first meal of the day Patient not taking: Reported on 02/01/2017 12/27/16   Tanda Rockers, MD    Family History Family History  Problem Relation Age of Onset  . Cancer Mother     Social History Social History  Substance Use Topics  . Smoking status: Never Smoker  . Smokeless tobacco: Never Used  . Alcohol use Yes     Comment: socially     Allergies   Percocet [oxycodone-acetaminophen] and Penicillins   Review of Systems Review of  Systems  Constitutional: Negative for chills and fever.  Respiratory: Negative for shortness of breath.   Cardiovascular: Negative for chest pain.  Gastrointestinal: Positive for abdominal pain. Negative for blood in stool, constipation, diarrhea, nausea and vomiting.  Genitourinary: Negative for dysuria, frequency, vaginal bleeding and vaginal discharge.  All other systems reviewed and are negative.    Physical Exam Updated Vital Signs BP 109/70   Pulse 63   Temp 98.5 F (36.9 C) (Oral)   Resp 16   LMP 03/13/2017   SpO2 100%   Breastfeeding? No   Physical Exam  Constitutional: She appears well-developed and well-nourished. No distress.  Well-appearing, resting comfortably in bed.  HENT:  Head: Normocephalic and atraumatic.  Eyes: Conjunctivae are normal.  Cardiovascular: Normal rate, regular rhythm, normal heart sounds and intact distal pulses.  Exam reveals no friction rub.   No murmur heard. Pulmonary/Chest: Effort normal and breath sounds normal. No respiratory distress. She has no wheezes. She has no rales.  Abdominal: Soft. Bowel sounds are normal. She exhibits no distension and no mass. There is tenderness. There is no rebound and no guarding. A hernia is present.  Small defect and abdominal wall just distal to umbilicus with associated tenderness.  Small hernia felt that is easily reducible.  Neurological: She is alert.  Skin: Skin is warm.  Psychiatric: She has a normal mood and affect. Her behavior is normal.  Nursing note and vitals reviewed.    ED Treatments / Results  Labs (all labs ordered are listed, but only abnormal results are displayed) Labs Reviewed  COMPREHENSIVE METABOLIC PANEL - Abnormal; Notable for the following:       Result Value   Glucose, Bld 100 (*)    All other components within normal limits  CBC - Abnormal; Notable for the following:    Hemoglobin 10.8 (*)    HCT 33.8 (*)    MCH 25.1 (*)    All other components within normal limits    LIPASE, BLOOD  CBG MONITORING, ED  I-STAT BETA HCG BLOOD, ED ([facility], [facility], [facility] ONLY)    EKG  EKG Interpretation None       Radiology No results found.  Procedures Procedures (including critical care time)  Medications Ordered in ED Medications - No data to display   Initial Impression / Assessment and Plan / ED Course  I have reviewed the triage vital signs and the nursing notes.  Pertinent labs & imaging results that were available during my care of the patient were reviewed by me and considered in my medical decision making (see chart for details).    Patient presenting with 1 month of intermittent periumbilical abdominal pain that acutely worsened overnight.  On exam, patient without peritoneal signs or significant tenderness.  Small defect palpated in abdominal wall just distal to umbilicus,  suspect small reducible hernia.  CBC without elevated white count, stable anemia.  CMP and lipase normal.  Patient afebrile, nontoxic-appearing.  Discussion about CT scan, however patient with concern about radiation exposure and declined imaging today.  Transvaginal/pelvic ultrasound done last week and negative.  Abdominal x-ray done on 03/21/2017 and negative.  Patient without peritoneal signs, not in distress, doubt acute abdominal pathology. Patient does not meet the SIRS or Sepsis criteria.  No indication of bowel obstruction, bowel perforation, cholecystitis, diverticulitis, PID or ectopic pregnancy.Will give pt general surgery referral for re-evaluation of possible hernia. Pt is safe for discharge.  Patient discussed with and seen by Dr. Wyvonnia Dusky, who agrees with care plan for discharge.  Discussed results, findings, treatment and follow up. Patient advised of return precautions. Patient verbalized understanding and agreed with plan.  Final Clinical Impressions(s) / ED Diagnoses   Final diagnoses:  Periumbilical abdominal pain    New Prescriptions New Prescriptions   No medications  on file     Bo Teicher, [place] N, PA-C 04/02/17 [id number]    Ezequiel Essex, MD 04/02/17 [phone]

## 2017-04-02 NOTE — Discharge Instructions (Signed)
Please schedule an appointment with the surgical clinic to re-evaluate the possible hernia in your abdomen, and to discuss further treatment options. Return to the ER for severely worsening pain, a bulge in your abdomen that you are unable to push back in, vomiting, blood in your stool, or if you stop having bowel movements.

## 2017-04-03 ENCOUNTER — Other Ambulatory Visit: Payer: Self-pay | Admitting: Family Medicine

## 2017-04-03 DIAGNOSIS — R109 Unspecified abdominal pain: Secondary | ICD-10-CM

## 2017-04-09 ENCOUNTER — Other Ambulatory Visit: Payer: BLUE CROSS/BLUE SHIELD

## 2017-04-11 ENCOUNTER — Other Ambulatory Visit: Payer: BLUE CROSS/BLUE SHIELD

## 2017-04-18 ENCOUNTER — Other Ambulatory Visit: Payer: Self-pay | Admitting: Surgery

## 2017-04-18 DIAGNOSIS — R1033 Periumbilical pain: Secondary | ICD-10-CM

## 2017-04-19 ENCOUNTER — Ambulatory Visit: Payer: Self-pay | Admitting: Physician Assistant

## 2017-04-22 ENCOUNTER — Ambulatory Visit
Admission: RE | Admit: 2017-04-22 | Discharge: 2017-04-22 | Disposition: A | Discharge status: Home / Self Care | Payer: BLUE CROSS/BLUE SHIELD | Source: Ambulatory Visit | Attending: Surgery | Admitting: Surgery

## 2017-04-22 ENCOUNTER — Ambulatory Visit: Payer: Self-pay | Admitting: Surgery

## 2017-04-22 DIAGNOSIS — R1033 Periumbilical pain: Secondary | ICD-10-CM

## 2017-04-22 MED ORDER — IOPAMIDOL (ISOVUE-300) INJECTION 61%: 100.0000 mL | Freq: Once | INTRAVENOUS | Status: DC | PRN

## 2017-04-23 ENCOUNTER — Other Ambulatory Visit: Payer: Self-pay | Admitting: Obstetrics & Gynecology

## 2017-04-23 DIAGNOSIS — N644 Mastodynia: Secondary | ICD-10-CM

## 2017-04-29 ENCOUNTER — Ambulatory Visit
Admission: RE | Admit: 2017-04-29 | Discharge: 2017-04-29 | Disposition: A | Discharge status: Home / Self Care | Payer: BLUE CROSS/BLUE SHIELD | Source: Ambulatory Visit | Attending: Obstetrics & Gynecology | Admitting: Obstetrics & Gynecology

## 2017-04-29 DIAGNOSIS — N644 Mastodynia: Secondary | ICD-10-CM

## 2017-04-30 ENCOUNTER — Other Ambulatory Visit: Payer: BLUE CROSS/BLUE SHIELD

## 2017-05-04 ENCOUNTER — Telehealth: Payer: Self-pay | Admitting: Genetics

## 2017-05-04 ENCOUNTER — Encounter: Payer: Self-pay | Admitting: Genetics

## 2017-05-04 NOTE — Telephone Encounter (Signed)
Genetic counseling appt has been scheduled for the pt to see Ferol Luz on 07/22/17 at 3pm. Letter mailed to the pt with directions.

## 2017-05-06 ENCOUNTER — Ambulatory Visit: Payer: BLUE CROSS/BLUE SHIELD | Admitting: Physician Assistant

## 2017-05-06 ENCOUNTER — Other Ambulatory Visit: Payer: Self-pay

## 2017-05-06 ENCOUNTER — Encounter: Payer: Self-pay | Admitting: Physician Assistant

## 2017-05-06 VITALS — BP 116/86 | HR 84 | Temp 98.8°F | Resp 18 | Ht 64.84 in | Wt 132.0 lb

## 2017-05-06 DIAGNOSIS — R591 Generalized enlarged lymph nodes: Secondary | ICD-10-CM

## 2017-05-06 DIAGNOSIS — D509 Iron deficiency anemia, unspecified: Secondary | ICD-10-CM

## 2017-05-06 NOTE — Patient Instructions (Addendum)
Your physical exam findings are reassuring today. This is likely a reactive response. We are going to check a CBC today and this should inform us if there is any abnormality in the blood.  Results will take 4-5 days to return.  We will contact you with these results. If your symptoms persist after 1-2 weeks or they start to worsen (I.e., the nodes get much larger), please contact our office. We may do imaging at that time and consider adding additional labs. Thank you for letting me participate in your health and well being.    IF you received an x-ray today, you will receive an invoice from [facility]. Please contact [facility] at [phone] with questions or concerns regarding your invoice.   IF you received labwork today, you will receive an invoice from [facility]. Please contact [facility] at [phone] with questions or concerns regarding your invoice.   Our billing staff will not be able to assist you with questions regarding bills from these companies.  You will be contacted with the lab results as soon as they are available. The fastest way to get your results is to activate your My Chart account. Instructions are located on the last page of this paperwork. If you have not heard from [place] regarding the results in 2 weeks, please contact this office.

## 2017-05-06 NOTE — Progress Notes (Signed)
Tiffany Carter  MRN: [id number] DOB: [birth year]  Subjective:  Tiffany Carter is a 36 y.o. female seen in office today for a chief complaint of left sided swollen lymph nodes x 1 day.  Notes she has mild discomfort in the left side of her neck yesterday evening and palpated two swollen lymph nodes.  Denies sore throat,fever, diaphoresis, fatigue, and cough. 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 She has been dealing with left sided shoulder pain x 6 months. She saw an orthopedic specialist 3 months ago and notes the images were normal. She notes the pain as been getting worse and is radiating to her neck. Has tried ibuprofen, biofreeze, and hot compress with mild relief. Denies smoking and alcohol use. No new medications.  FH of lymphoma in aunt. No hx of thyroid or autoimmune disorder that she is aware of.   Review of Systems  Constitutional: Positive for unexpected weight change (7lb weight loss in the past month). Negative for appetite change.  HENT: Negative for ear pain, facial swelling, sinus pressure, trouble swallowing and voice change.   Respiratory: Negative for shortness of breath and wheezing.   Gastrointestinal: Positive for abdominal pain ( intermittent, currently dealing with umbilical hernia, has procedure scheduled for 06/2017). Negative for nausea and vomiting.  Genitourinary: Negative for dysuria, urgency, vaginal discharge and vaginal pain.  Neurological: Negative for dizziness, weakness, light-headedness, numbness and headaches.    Patient Active Problem List   Diagnosis Date Noted  . Abnormal CXR with multiple nodules 12/28/2016  . Upper airway cough syndrome 12/27/2016  . S/P cesarean section 10/28/2013    Current Outpatient Medications on File Prior to Visit  Medication Sig Dispense Refill  . EVENING PRIMROSE OIL PO Take 300 mg by mouth.    . Multiple Vitamin (MULTIVITAMIN) capsule Take 1 capsule by  mouth daily.    . benzonatate (TESSALON) 200 MG capsule Take 1 capsule (200 mg total) by mouth 3 (three) times daily as needed for cough. (Patient not taking: Reported on 02/01/2017) 30 capsule 1  . famotidine (PEPCID) 20 MG tablet One at bedtime (Patient not taking: Reported on 02/01/2017) 30 tablet 2  . ibuprofen (ADVIL,MOTRIN) 200 MG tablet Take 200 mg by mouth every 6 (six) hours as needed.    . pantoprazole (PROTONIX) 40 MG tablet Take 1 tablet (40 mg total) by mouth daily. Take 30-60 min before first meal of the day (Patient not taking: Reported on 02/01/2017) 30 tablet 2   No current facility-administered medications on file prior to visit.     Allergies  Allergen Reactions  . Percocet [Oxycodone-Acetaminophen] Other (See Comments)    Loses Memory Can tolerate Vicodin  . Penicillins Hives     Objective:  BP 116/86 (BP Location: Left Arm, Patient Position: Sitting, Cuff Size: Normal)   Pulse 84   Temp 98.8 F (37.1 C) (Oral)   Resp 18   Ht 5' 4.84" (1.647 m)   Wt 132 lb (59.9 kg)   LMP 04/13/2017 (Exact Date)   SpO2 99%   BMI 22.07 kg/m   Physical Exam  Constitutional: She is oriented to person, place, and time and well-developed, well-nourished, and in no distress.  HENT:  Head: Normocephalic and atraumatic.  Right Ear: Tympanic membrane, external ear and ear canal normal.  Left Ear: Tympanic membrane, external ear and ear canal normal.  Mouth/Throat: Uvula is midline, oropharynx is clear and moist and mucous membranes  are normal.  Eyes: Conjunctivae are normal.  Neck: Normal range of motion and full passive range of motion without pain. No muscular tenderness present. No neck rigidity. Normal range of motion present.  Pulmonary/Chest: Effort normal.  Musculoskeletal:       Left shoulder: She exhibits tenderness (mild with palpation of musculature). She exhibits normal range of motion, no bony tenderness, no swelling and normal strength.  Lymphadenopathy:       Head  (right side): No submental, no submandibular, no tonsillar, no preauricular, no posterior auricular and no occipital adenopathy present.       Head (left side): No submental, no submandibular, no tonsillar, no preauricular, no posterior auricular and no occipital adenopathy present.    She has cervical adenopathy.       Right cervical: No superficial cervical, no deep cervical and no posterior cervical adenopathy present.      Left cervical: Superficial cervical (2 small (~0.5-0.75 cm) anterior cervical lymph nodes palpated) adenopathy present. No deep cervical and no posterior cervical adenopathy present.    She has no axillary adenopathy.       Right: No inguinal and no supraclavicular adenopathy present.       Left: No inguinal and no supraclavicular adenopathy present.  Neurological: She is alert and oriented to person, place, and time. Gait normal.  Skin: Skin is warm and dry.  Psychiatric: Affect normal.  Vitals reviewed.  Wt Readings from Last 3 Encounters:  05/06/17 132 lb (59.9 kg)  02/01/17 136 lb 6.4 oz (61.9 kg)  12/27/16 137 lb (62.1 kg)    Assessment and Plan :  This case was precepted with Dr. Pamella Pert.  1. Lymphadenopathy Pt is well appearing healthy female.  Vitals stable. There were 2 small cervical lymph nodes palpated on exam. No other lymphadenopathy noted. Reassured pt that this is likely a reactive process.  Labs pending. Pt educated that it can take up to 1 month for reactive lymph nodes to fully resolve. Since they have only been present for one day, suggest monitoring at this time. Encouraged to return to clinic if symptoms worsen, do not improve in 1-2 weeks, or as needed. Consider additional labs and imaging if persist > 1 month.  - CBC with Differential/Platelet  Tenna Delaine PA-C  [facility] 05/06/2017 3:11 PM

## 2017-05-07 ENCOUNTER — Encounter: Payer: Self-pay | Admitting: Physician Assistant

## 2017-05-07 LAB — CBC WITH DIFFERENTIAL/PLATELET
BASOS ABS: 0 10*3/uL (ref 0.0–0.2)
Basos: 0 %
EOS (ABSOLUTE): 0.2 10*3/uL (ref 0.0–0.4)
EOS: 3 %
Hematocrit: 34 % (ref 34.0–46.6)
Hemoglobin: 10.9 g/dL — ABNORMAL LOW (ref 11.1–15.9)
Immature Grans (Abs): 0 10*3/uL (ref 0.0–0.1)
Immature Granulocytes: 0 %
LYMPHS ABS: 1.5 10*3/uL (ref 0.7–3.1)
Lymphs: 25 %
MCH: 24.3 pg — AB (ref 26.6–33.0)
MCHC: 32.1 g/dL (ref 31.5–35.7)
MCV: 76 fL — AB (ref 79–97)
MONOCYTES: 7 %
MONOS ABS: 0.4 10*3/uL (ref 0.1–0.9)
NEUTROS ABS: 4 10*3/uL (ref 1.4–7.0)
Neutrophils: 65 %
PLATELETS: 319 10*3/uL (ref 150–379)
RBC: 4.49 x10E6/uL (ref 3.77–5.28)
RDW: 15.7 % — AB (ref 12.3–15.4)
WBC: 6.1 10*3/uL (ref 3.4–10.8)

## 2017-05-08 ENCOUNTER — Encounter: Payer: Self-pay | Admitting: Physician Assistant

## 2017-05-08 NOTE — Addendum Note (Signed)
Addended by: Gari Crown D on: 05/08/2017 02:37 PM   Modules accepted: Orders

## 2017-05-09 LAB — IRON,TIBC AND FERRITIN PANEL
FERRITIN: 9 ng/mL — AB (ref 15–150)
IRON SATURATION: 5 % — AB (ref 15–55)
IRON: 21 ug/dL — AB (ref 27–159)
Total Iron Binding Capacity: 457 ug/dL — ABNORMAL HIGH (ref 250–450)
UIBC: 436 ug/dL — AB (ref 131–425)

## 2017-05-13 ENCOUNTER — Ambulatory Visit: Payer: BLUE CROSS/BLUE SHIELD | Admitting: Sports Medicine

## 2017-05-14 ENCOUNTER — Other Ambulatory Visit: Payer: Self-pay | Admitting: Physician Assistant

## 2017-05-14 ENCOUNTER — Telehealth: Payer: Self-pay | Admitting: Physician Assistant

## 2017-05-14 DIAGNOSIS — D649 Anemia, unspecified: Secondary | ICD-10-CM

## 2017-05-14 MED ORDER — FERROUS SULFATE 325 (65 FE) MG PO TBEC: 325.0000 mg | DELAYED_RELEASE_TABLET | Freq: Three times a day (TID) | ORAL | 1 refills | Status: DC

## 2017-05-14 NOTE — Progress Notes (Signed)
Meds ordered this encounter  Medications  . ferrous sulfate 325 (65 FE) MG EC tablet    Sig: Take 1 tablet (325 mg total) by mouth 3 (three) times daily with meals.    Dispense:  90 tablet    Refill:  1    Order Specific Question:   Supervising Provider    Answer:   Wardell Honour [[id number]]

## 2017-05-14 NOTE — Telephone Encounter (Signed)
Contacted pt to discuss lab results. Please see result note for further details.

## 2017-05-16 ENCOUNTER — Ambulatory Visit: Payer: Self-pay | Admitting: *Deleted

## 2017-05-16 ENCOUNTER — Telehealth: Payer: Self-pay | Admitting: Physician Assistant

## 2017-05-16 NOTE — Telephone Encounter (Signed)
Patient contacted our office back and I informed her of everything in the last telephone encounter note.  She is still having some abdominal cramping and nausea.  Educated her that this should continue to improve.  Recommended trying over-the-counter slow iron tablets.  Recommend taking them with a little bit of food.  Contact her office if she is not able to tolerate iron in this form.  Recommended to come into office if any of her symptoms worsen or do not improve with discontinuation of current iron supplementation or she develops new concerning symptoms.

## 2017-05-16 NOTE — Telephone Encounter (Signed)
Pt returned call and [place] took call to talk with pt.

## 2017-05-16 NOTE — Telephone Encounter (Signed)
Called in c/o abd pain and back pain that is worse with standing up and better with laying down.   She started taking iron last night for the first time.   She is laying on a heating pad for her back due to the "cramping" pain.  She is also feeling nauseated but can eat and drink without change in the pain and no vomiting.  "I just feel bad" I went over home care advice for her to try but in the meantime I have routed a high priority note to Dr. Eloisa Northern nurse pool. I instructed her to call back if her symptoms get worse or she hasn't heard from [place] by around lunch time today.   She verbalized understanding of these instructions.  Reason for Disposition . [1] MILD-MODERATE pain AND [2] comes and goes (cramps)  Answer Assessment - Initial Assessment Questions 1. LOCATION: "Where does it hurt?"      I'm feeling nausea.   It's a cramp pain.  It feels better when I lay down.   It really starts to hurt when I get up. 2. RADIATION: "Does the pain shoot anywhere else?" (e.g., chest, back)     It the same as the front.  I'm laying on a heating pad for my back now because it hurts.  No falls or back injuries recently. 3. ONSET: "When did the pain begin?" (e.g., minutes, hours or days ago)      Started this morning when I got up.  I ate this morning and it did not have any effect on the pain. 4. SUDDEN: "Gradual or sudden onset?"     Happened suddenly. 5. PATTERN "Does the pain come and go, or is it constant?"    - If constant: "Is it getting better, staying the same, or worsening?"      (Note: Constant means the pain never goes away completely; most serious pain is constant and it progresses)     - If intermittent: "How long does it last?" "Do you have pain now?"     (Note: Intermittent means the pain goes away completely between bouts)     It comes and goes.   Now it's a 2 but standing it's a 6. 6. SEVERITY: "How bad is the pain?"  (e.g., Scale 1-10; mild, moderate, or severe)   - MILD (1-3): doesn't  interfere with normal activities, abdomen soft and not tender to touch    - MODERATE (4-7): interferes with normal activities or awakens from sleep, tender to touch    - SEVERE (8-10): excruciating pain, doubled over, unable to do any normal activities      No.   Not on period.   Had it a week ago. 7. RECURRENT SYMPTOM: "Have you ever had this type of abdominal pain before?" If so, ask: "When was the last time?" and "What happened that time?"      Never had this pain before. 8. CAUSE: "What do you think is causing the abdominal pain?"     I think it's the iron pills.  I took one last night that was my first dose. 9. RELIEVING/AGGRAVATING FACTORS: "What makes it better or worse?" (e.g., movement, antacids, bowel movement)     When I lay down it feels better but when I get up I feel more pain and nausea. 10. OTHER SYMPTOMS: "Has there been any vomiting, diarrhea, constipation, or urine problems?"       No diarrhea or urinary symptoms. 11. PREGNANCY: "Is there any chance you  are pregnant?" "When was your last menstrual period?"       No   Had period last wk.  Protocols used: ABDOMINAL PAIN [facility]

## 2017-05-16 NOTE — Telephone Encounter (Signed)
Phone triage/message sent to [employer] priority

## 2017-05-16 NOTE — Telephone Encounter (Signed)
Please call patient and ask if she is still having any abdominal cramping and nausea. Common side effects of iron are cramping and nausea.  Typically after you have taken this medication, symptoms should start to fully resolve about 12-14 hours of ingesting medication.  Considering she had such adverse reaction to this tablet, I recommend she pick up slow iron over-the-counter to take instead of the one I prescribed.  She may also take with a little bit of food to help.  And also use an over-the-counter Zantac or Prilosec daily while on the iron. If she develops any worsening symptoms or new concerning symptoms she should come to office for evaluation. There are multiple openings for other providers today.

## 2017-05-22 ENCOUNTER — Encounter (HOSPITAL_COMMUNITY): Payer: Self-pay

## 2017-05-22 NOTE — Patient Instructions (Signed)
Tiffany Carter  05/22/2017   Your procedure is scheduled on: Thursday, Jan. 3, 2018   Report to [facility] Main  Entrance   Take [place]  elevators to 3rd floor to  [place] at 7:30 AM.   Call this number if you have problems the morning of surgery [phone]    Remember: ONLY 1 PERSON MAY GO WITH YOU TO SHORT STAY TO GET  READY MORNING OF [place].   Do not eat food or drink liquids :After Midnight.   Take these medicines the morning of surgery with A SIP OF WATER: None              You may not have any metal on your body including hair pins, jewelry, and body  piercings               Do not wear make-up, lotions, powders, perfumes,or deodorant             Do not wear nail polish.  Do not shave  48 hours prior to surgery.                Do not bring valuables to the hospital. [place].   Contacts, dentures or bridgework may not be worn into surgery.   Patients discharged the day of surgery will not be allowed to drive home.   Name and phone number of your driver:   Special Instructions: N/A              Please read over the following fact sheets you were given: _____________________________________________________________________        [facility] - Preparing for Surgery Before surgery, you can play an important role.  Because skin is not sterile, your skin needs to be as free of germs as possible.  You can reduce the number of germs on your skin by washing with CHG (chlorahexidine gluconate) soap before surgery.  CHG is an antiseptic cleaner which kills germs and bonds with the skin to continue killing germs even after washing. Please DO NOT use if you have an allergy to CHG or antibacterial soaps.  If your skin becomes reddened/irritated stop using the CHG and inform your nurse when you arrive at Short Stay. Do not shave (including legs and underarms) for at least 48 hours prior to the  first CHG shower.  You may shave your face/neck.  Please follow these instructions carefully:  1.  Shower with CHG Soap the night before surgery and the  morning of surgery.  2.  If you choose to wash your hair, wash your hair first as usual with your normal  shampoo.  3.  After you shampoo, rinse your hair and body thoroughly to remove the shampoo.                             4.  Use CHG as you would any other liquid soap.  You can apply chg directly to the skin and wash.  Gently with a scrungie or clean washcloth.  5.  Apply the CHG Soap to your body ONLY FROM THE NECK DOWN.   Do   not use on face/ open  Wound or open sores. Avoid contact with eyes, ears mouth and   genitals (private parts).                       Wash face,  Genitals (private parts) with your normal soap.             6.  Wash thoroughly, paying special attention to the area where your    surgery  will be performed.  7.  Thoroughly rinse your body with warm water from the neck down.  8.  DO NOT shower/wash with your normal soap after using and rinsing off the CHG Soap.                9.  Pat yourself dry with a clean towel.            10.  Wear clean pajamas.            11.  Place clean sheets on your bed the night of your first shower and do not  sleep with pets. Day of Surgery : Do not apply any lotions/deodorants the morning of surgery.  Please wear clean clothes to the hospital/surgery center.  FAILURE TO FOLLOW THESE INSTRUCTIONS MAY RESULT IN THE CANCELLATION OF YOUR SURGERY  PATIENT SIGNATURE_________________________________  NURSE SIGNATURE__________________________________  ________________________________________________________________________

## 2017-05-22 NOTE — Pre-Procedure Instructions (Signed)
The following are in epic: Last office note 05/06/17 [facility] PA EKG 04/02/17 CXR 12/25/16 Abd 2 view 03/21/17 CT abd 04/22/17

## 2017-05-24 ENCOUNTER — Encounter (HOSPITAL_COMMUNITY)
Admission: RE | Admit: 2017-05-24 | Discharge: 2017-05-24 | Disposition: A | Discharge status: Home / Self Care | Payer: BLUE CROSS/BLUE SHIELD | Source: Ambulatory Visit | Attending: Surgery | Admitting: Surgery

## 2017-05-24 HISTORY — DX: Generalized enlarged lymph nodes: R59.1

## 2017-05-24 HISTORY — DX: Constipation, unspecified: K59.00

## 2017-05-24 HISTORY — DX: Chronic rhinitis: J31.0

## 2017-05-24 HISTORY — DX: Umbilical hernia without obstruction or gangrene: K42.9

## 2017-05-24 HISTORY — DX: Calculus of kidney: N20.0

## 2017-05-24 HISTORY — DX: Chronic sinusitis, unspecified: J32.9

## 2017-06-06 ENCOUNTER — Ambulatory Visit (HOSPITAL_COMMUNITY): Admission: RE | Admit: 2017-06-06 | Payer: BLUE CROSS/BLUE SHIELD | Source: Ambulatory Visit | Admitting: Surgery

## 2017-06-06 ENCOUNTER — Encounter (HOSPITAL_COMMUNITY): Admission: RE | Payer: Self-pay | Source: Ambulatory Visit

## 2017-06-06 SURGERY — REPAIR, HERNIA, INGUINAL, ADULT
Anesthesia: General

## 2017-07-18 ENCOUNTER — Telehealth: Payer: Self-pay | Admitting: Genetics

## 2017-07-18 NOTE — Telephone Encounter (Signed)
Patient called to cancel she is moving

## 2017-07-22 ENCOUNTER — Encounter: Payer: BLUE CROSS/BLUE SHIELD | Admitting: Genetics

## 2017-07-22 ENCOUNTER — Other Ambulatory Visit: Payer: BLUE CROSS/BLUE SHIELD

## 2017-08-29 ENCOUNTER — Encounter: Payer: Self-pay | Admitting: Physician Assistant

## 2017-08-29 ENCOUNTER — Other Ambulatory Visit: Payer: Self-pay

## 2017-08-29 ENCOUNTER — Ambulatory Visit: Payer: BLUE CROSS/BLUE SHIELD | Admitting: Physician Assistant

## 2017-08-29 VITALS — BP 129/83 | HR 83 | Temp 98.8°F | Resp 17 | Ht 64.84 in | Wt 135.6 lb

## 2017-08-29 DIAGNOSIS — D509 Iron deficiency anemia, unspecified: Secondary | ICD-10-CM

## 2017-08-29 DIAGNOSIS — M5412 Radiculopathy, cervical region: Secondary | ICD-10-CM

## 2017-08-29 MED ORDER — NAPROXEN 500 MG PO TABS: 500.0000 mg | ORAL_TABLET | Freq: Two times a day (BID) | ORAL | 0 refills | Status: DC

## 2017-08-29 NOTE — Patient Instructions (Addendum)
  I recommend stretching, ice, naproxen and flexeril. If no improvement in 5 days, return to office for further evaluation. Thank you for letting me participate in your health and well being.  Cervical Radiculopathy Cervical radiculopathy means that a nerve in the neck is pinched or bruised. This can cause pain or loss of feeling (numbness) that runs from your neck to your arm and fingers. Follow these instructions at home: Managing pain  Take over-the-counter and prescription medicines only as told by your doctor.  If directed, put ice on the injured or painful area. ? Put ice in a plastic bag. ? Place a towel between your skin and the bag. ? Leave the ice on for 20 minutes, 2-3 times per day.  If ice does not help, you can try using heat. Take a warm shower or warm bath, or use a heat pack as told by your doctor.  You may try a gentle neck and shoulder massage. Activity  Rest as needed. Follow instructions from your doctor about any activities to avoid.  Do exercises as told by your doctor or physical therapist. General instructions  If you were given a soft collar, wear it as told by your doctor.  Use a flat pillow when you sleep.  Keep all follow-up visits as told by your doctor. This is important. Contact a doctor if:  Your condition does not improve with treatment. Get help right away if:  Your pain gets worse and is not controlled with medicine.  You lose feeling or feel weak in your hand, arm, face, or leg.  You have a fever.  You have a stiff neck.  You cannot control when you poop or pee (have incontinence).  You have trouble with walking, balance, or talking. This information is not intended to replace advice given to you by your health care provider. Make sure you discuss any questions you have with your health care provider. Document Released: 05/10/2011 Document Revised: 10/27/2015 Document Reviewed: 07/15/2014 Elsevier Interactive Patient Education  2018  [employer].    IF you received an x-ray today, you will receive an invoice from [facility]. Please contact [facility] at [phone] with questions or concerns regarding your invoice.   IF you received labwork today, you will receive an invoice from [place]. Please contact [facility] at [phone] with questions or concerns regarding your invoice.   Our billing staff will not be able to assist you with questions regarding bills from these companies.  You will be contacted with the lab results as soon as they are available. The fastest way to get your results is to activate your My Chart account. Instructions are located on the last page of this paperwork. If you have not heard from [place] regarding the results in 2 weeks, please contact this office.

## 2017-08-29 NOTE — Progress Notes (Signed)
Tiffany Carter  MRN: [id number] DOB: 1980/07/02  Subjective:  Tiffany Carter is a 37 y.o. female seen in office today for a chief complaint of right arm and right sided neck pain x 5 days.  Has associated tingling/prickly sensation in her thumb, index, and middle finger of right hand.  She noticed it after she was moving lots of boxes over the weekend.  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  Denies dropping objects, weakness, cold sensation, redness, warmth, and pallor.  No acute injury she is aware of.  Has tried ice, heat, stretches, and biofreeze with no full relief.  Notes the tingling/prickly sensation is worse in the morning after she wakes up.   Patient also like to have her labs drawn today.  She was supposed to follow-up in a couple months ago for lab only visit for repeat CBC and iron panel.  She has not been taking her iron consistently.  Only takes it when she remembers.  There is no other questions or concerns today.  Review of Systems  Per HPI  Patient Active Problem List   Diagnosis Date Noted  . Abnormal CXR with multiple nodules 12/28/2016  . Upper airway cough syndrome 12/27/2016  . S/P cesarean section 10/28/2013    Current Outpatient Medications on File Prior to Visit  Medication Sig Dispense Refill  . benzonatate (TESSALON) 200 MG capsule Take 1 capsule (200 mg total) by mouth 3 (three) times daily as needed for cough. (Patient not taking: Reported on 02/01/2017) 30 capsule 1  . Cholecalciferol (VITAMIN D3) 1000 units CAPS Take 3,000 Units by mouth once a week.    [place] EVENING PRIMROSE OIL PO Take 1 capsule by mouth daily.     . famotidine (PEPCID) 20 MG tablet One at bedtime (Patient not taking: Reported on 02/01/2017) 30 tablet 2  . ferrous sulfate 325 (65 FE) MG EC tablet Take 1 tablet (325 mg total) by mouth 3 (three) times daily with meals. 90 tablet 1  . ibuprofen (ADVIL,MOTRIN) 200 MG tablet Take 400 mg by mouth every 6 (six) hours as needed  for headache or moderate pain.     . Multiple Vitamin (MULTIVITAMIN) capsule Take 1 capsule by mouth daily.    . pantoprazole (PROTONIX) 40 MG tablet Take 1 tablet (40 mg total) by mouth daily. Take 30-60 min before first meal of the day (Patient not taking: Reported on 02/01/2017) 30 tablet 2  . tranexamic acid (LYSTEDA) 650 MG TABS tablet Take 650 mg by mouth See admin instructions. Take 650 mg by mouth 3 times daily with meals only during first 3 days of menstrual cycle    . TURMERIC PO Take 1 capsule by mouth daily as needed (for shoulder inflamation).     No current facility-administered medications on file prior to visit.     Allergies  Allergen Reactions  . Ciprofloxacin Hives and Other (See Comments)    Arm numbness  . Percocet [Oxycodone-Acetaminophen] Other (See Comments)    Loses Memory Can tolerate Vicodin  . Penicillins Hives and Other (See Comments)    Has patient had a PCN reaction causing immediate rash, facial/tongue/throat swelling, SOB or lightheadedness with hypotension: Unknown Has patient had a PCN reaction causing severe rash involving mucus membranes or skin necrosis: Unknown Has patient had a PCN reaction that required hospitalization: Unknown Has patient had a PCN reaction occurring within the last 10 years: No If all of the above answers are "NO", then may proceed  with Cephalosporin use.      Objective:  There were no vitals taken for this visit.  Physical Exam  Constitutional: She is oriented to person, place, and time and well-developed, well-nourished, and in no distress.  HENT:  Head: Normocephalic and atraumatic.  Eyes: Conjunctivae are normal.  Neck: Normal range of motion and full passive range of motion without pain. Muscular tenderness ( With palpation of right-sided musculature) present. No spinous process tenderness present. No neck rigidity. No erythema present.  Pulmonary/Chest: Effort normal.  Musculoskeletal:       Right shoulder: She  exhibits normal range of motion, no tenderness, no bony tenderness and no swelling.       Right upper arm: Normal. She exhibits no tenderness, no bony tenderness and no swelling.       Right forearm: Normal. She exhibits no tenderness, no bony tenderness and no swelling.       Right hand: She exhibits normal range of motion, no tenderness, normal capillary refill and no swelling. Normal sensation noted. Normal strength noted.  Neurological: She is alert and oriented to person, place, and time. Gait normal.  Reflex Scores:      Tricep reflexes are 2+ on the right side and 2+ on the left side.      Bicep reflexes are 2+ on the right side and 2+ on the left side.      Brachioradialis reflexes are 2+ on the right side and 2+ on the left side. Sensation of upper extremities intact bilaterally.  Muscular strength 5/5 of upper extremities bilaterally.  Positive Spurling's test to the right.  Positive shoulder abduction relief sign to the right.  Negative Tinel sign and Phalen maneuver of right hand.    Skin: Skin is warm and dry.  Psychiatric: Affect normal.  Vitals reviewed.   Assessment and Plan :  1. Cervical radiculopathy Hx and PE findings consistent with cervical radiculopathy. No upper extremity weakness noted. Discussed tx options with patient. She would like to try conservative tx with NSAIDs at this time. Recommend ice/heat to affected area, resting, and stretches as tolerated.  May also use muscle relaxant (which she has at home). Advised to -Return to clinic if symptoms worsen, do not improve in 5 days, or as needed. Will consider prednisone course at that time.  - naproxen (NAPROSYN) 500 MG tablet; Take 1 tablet (500 mg total) by mouth 2 (two) times daily with a meal.  Dispense: 30 tablet; Refill: 0  2. Iron deficiency anemia, unspecified iron deficiency anemia type Labs pending.  - CBC with Differential/Platelet - Iron, TIBC and Ferritin Panel  Tenna Delaine PA-C  Primary  Care at [place] 08/29/2017 5:30 PM

## 2017-08-30 ENCOUNTER — Encounter: Payer: Self-pay | Admitting: Physician Assistant

## 2017-08-30 LAB — CBC WITH DIFFERENTIAL/PLATELET
BASOS: 1 %
Basophils Absolute: 0 10*3/uL (ref 0.0–0.2)
EOS (ABSOLUTE): 0.4 10*3/uL (ref 0.0–0.4)
EOS: 6 %
HEMATOCRIT: 37.7 % (ref 34.0–46.6)
Hemoglobin: 13.1 g/dL (ref 11.1–15.9)
IMMATURE GRANULOCYTES: 0 %
Immature Grans (Abs): 0 10*3/uL (ref 0.0–0.1)
Lymphocytes Absolute: 2 10*3/uL (ref 0.7–3.1)
Lymphs: 31 %
MCH: 29.4 pg (ref 26.6–33.0)
MCHC: 34.7 g/dL (ref 31.5–35.7)
MCV: 85 fL (ref 79–97)
Monocytes Absolute: 0.6 10*3/uL (ref 0.1–0.9)
Monocytes: 9 %
NEUTROS PCT: 53 %
Neutrophils Absolute: 3.6 10*3/uL (ref 1.4–7.0)
PLATELETS: 251 10*3/uL (ref 150–379)
RBC: 4.45 x10E6/uL (ref 3.77–5.28)
RDW: 14.4 % (ref 12.3–15.4)
WBC: 6.7 10*3/uL (ref 3.4–10.8)

## 2017-08-30 LAB — IRON,TIBC AND FERRITIN PANEL
Ferritin: 22 ng/mL (ref 15–150)
IRON SATURATION: 16 % (ref 15–55)
Iron: 63 ug/dL (ref 27–159)
Total Iron Binding Capacity: 397 ug/dL (ref 250–450)
UIBC: 334 ug/dL (ref 131–425)

## 2017-11-06 ENCOUNTER — Encounter: Payer: Self-pay | Admitting: Physician Assistant

## 2017-11-16 ENCOUNTER — Encounter: Payer: Self-pay | Admitting: Physician Assistant

## 2017-11-20 ENCOUNTER — Ambulatory Visit: Payer: BLUE CROSS/BLUE SHIELD | Admitting: Physician Assistant

## 2017-11-22 ENCOUNTER — Ambulatory Visit: Payer: Self-pay | Admitting: Physician Assistant

## 2017-11-22 ENCOUNTER — Ambulatory Visit: Payer: BLUE CROSS/BLUE SHIELD | Admitting: Physician Assistant

## 2017-11-27 ENCOUNTER — Ambulatory Visit (INDEPENDENT_AMBULATORY_CARE_PROVIDER_SITE_OTHER): Payer: No Typology Code available for payment source | Admitting: Physician Assistant

## 2017-11-27 ENCOUNTER — Encounter: Payer: Self-pay | Admitting: Physician Assistant

## 2017-11-27 ENCOUNTER — Other Ambulatory Visit: Payer: Self-pay

## 2017-11-27 VITALS — BP 100/76 | HR 82 | Temp 98.6°F | Resp 16 | Ht 64.0 in | Wt 135.2 lb

## 2017-11-27 DIAGNOSIS — Z862 Personal history of diseases of the blood and blood-forming organs and certain disorders involving the immune mechanism: Secondary | ICD-10-CM

## 2017-11-27 DIAGNOSIS — M5440 Lumbago with sciatica, unspecified side: Secondary | ICD-10-CM | POA: Diagnosis not present

## 2017-11-27 DIAGNOSIS — R509 Fever, unspecified: Secondary | ICD-10-CM

## 2017-11-27 LAB — POCT URINALYSIS DIP (MANUAL ENTRY)
Bilirubin, UA: NEGATIVE
Blood, UA: NEGATIVE
Glucose, UA: NEGATIVE mg/dL
Ketones, POC UA: NEGATIVE mg/dL
Leukocytes, UA: NEGATIVE
Nitrite, UA: NEGATIVE
Protein Ur, POC: NEGATIVE mg/dL
Spec Grav, UA: 1.01 (ref 1.010–1.025)
Urobilinogen, UA: 0.2 U/dL
pH, UA: 7 (ref 5.0–8.0)

## 2017-11-27 NOTE — Progress Notes (Signed)
Tiffany Carter  MRN: [id number] DOB: 1980/10/08  PCP: Tiffany Hedges, DO  Subjective:  Pt is a 37 year old female who presents to clinic for several complaints.   1) fever - she has been taking her temp at home and readings have been around 99 x 1 week. This has been worrying for her. Temp today in the office is 98.6.   2) back pain - low back pain. This is a chronic problem for her. She stretches, which helps some. Stays hydrated. Denies urinary symptoms, muscle weakness, n/t.    3) hist anemia - Would like recheck. Denies symptoms.   Of note, she admits to anxiety surrounding her health 2/2 family history.   Review of Systems  Constitutional: Positive for fatigue and fever. Negative for chills and diaphoresis.  Gastrointestinal: Negative for abdominal pain, constipation, diarrhea, nausea and vomiting.  Musculoskeletal: Positive for back pain. Negative for arthralgias and myalgias.  Neurological: Negative for dizziness, weakness, light-headedness, numbness and headaches.  Psychiatric/Behavioral: The patient is nervous/anxious.     Patient Active Problem List   Diagnosis Date Noted  . Abnormal CXR with multiple nodules 12/28/2016  . Upper airway cough syndrome 12/27/2016  . S/P cesarean section 10/28/2013    Current Outpatient Medications on File Prior to Visit  Medication Sig Dispense Refill  . Multiple Vitamin (MULTIVITAMIN) capsule Take 1 capsule by mouth daily.    . tranexamic acid (LYSTEDA) 650 MG TABS tablet Take 650 mg by mouth See admin instructions. Take 650 mg by mouth 3 times daily with meals only during first 3 days of menstrual cycle    . TURMERIC PO Take 1 capsule by mouth daily as needed (for shoulder inflamation).    . Cholecalciferol (VITAMIN D3) 1000 units CAPS Take 3,000 Units by mouth once a week.    . famotidine (PEPCID) 20 MG tablet One at bedtime (Patient not taking: Reported on 02/01/2017) 30 tablet 2  . pantoprazole (PROTONIX) 40 MG tablet Take 1  tablet (40 mg total) by mouth daily. Take 30-60 min before first meal of the day (Patient not taking: Reported on 08/29/2017) 30 tablet 2   No current facility-administered medications on file prior to visit.     Allergies  Allergen Reactions  . Ciprofloxacin Hives and Other (See Comments)    Arm numbness  . Percocet [Oxycodone-Acetaminophen] Other (See Comments)    Loses Memory Can tolerate Vicodin  . Penicillins Hives and Other (See Comments)    Has patient had a PCN reaction causing immediate rash, facial/tongue/throat swelling, SOB or lightheadedness with hypotension: Unknown Has patient had a PCN reaction causing severe rash involving mucus membranes or skin necrosis: Unknown Has patient had a PCN reaction that required hospitalization: Unknown Has patient had a PCN reaction occurring within the last 10 years: No If all of the above answers are "NO", then may proceed with Cephalosporin use.      Objective:  BP 100/76 (BP Location: Left Arm, Patient Position: Sitting, Cuff Size: Normal)   Pulse 82   Temp 98.6 F (37 C) (Oral)   Resp 16   Ht _0  (1.626 m)   Wt 135 lb 3.2 oz (61.3 kg)   LMP 11/21/2017   SpO2 100%   BMI 23.21 kg/m   Physical Exam  Assessment and Plan :  1. Acute low back pain with sciatica, sciatica laterality unspecified, unspecified back pain laterality - this is a chronic problem for this pt. No red flag signs or symptoms. UA is negative. Appears  to be MSK in nature. Advised RTC if she would like to try dry needling. Advised stretches and massage.  - POCT urinalysis dipstick - CMP14+EGFR - CBC  2. History of iron deficiency anemia - Iron, TIBC and Ferritin Panel - labs pending. Will contact with results.  3. Fever, unspecified fever cause - pt believed she had a fever at home, however it appears her thermometer is not calibrated correctly.   Mercer Pod, PA-C  [facility] at [place] 11/27/2017 5:44 PM

## 2017-11-27 NOTE — Patient Instructions (Addendum)
Apply moist heat to the area. Wet a towel and wring it out so it is damp. Put it in the microwave for about 15-20 seconds - long enough to make it hot, but not too hot to apply to your skin causing burns. Do this for about 20-30 minutes, 3-4 times a day.   Perform gentle, light stretches 2-3 times a day.   Put a tennis ball between your back and a wall. Gentle massage the area with rolling the ball around the affected area. Try a foam roller.   Stay well hydrated - try to drink 32-64 oz/day.   If you feel like you need a new pillow, try "My Pillow".  Come back and see me if you would like to try a dry needling session. (see below).    Trigger Point Dry Needling   What is Trigger Point Dry Needling (DN)?   1. DN is a physical therapy technique used to treat muscle pain and Dysfunction.  Specifically, DN helps deactivate muscle trigger points (Muscle Knots).   2. A thin filiform needle is used to penetrate the skin and stimulate the underlying trigger point.  The goal is for a local twitch response (LTR) to occur and for the trigger point to relax.  No medication of any kind is injected during the procedure.   What Does Trigger Point Dry Needling Feel Like?   1. The procedures feels different for each individual patient.   Some patients report that they do not actually feel the needle enter the skin and overall the process is not  painful.  Very  mild bleeding may occur.  However, many patients feel a deep cramping in the muscle in which the needle was inserted. This is the local twitch response.    How Will I Feel After The Treatment?   1. Soreness is normal, and the onset of soreness may not occur for a few hours.  Typically this soreness does not last longer than two  days.   2. Bruising is uncommon, however; ice can be used to decrease any possible bruising.   3. In rare cases feeling tired or nauseous after the treatment is normal.  In addition, your symptoms may get worse before  they get better, this period will typically not last longer than 24 hours.   What Can I do After My Treatment?   1.  Increase your hydration by drinking more water for the next 24 hours.   2.  You may place ice or heat on the areas treated that have become sore, however don not use heat on inflamed or bruised areas.     Heat often brings more relief post needling.   3. You can continue your regular activities, but vigorous activity is not recommended initially after the treatment for 24 hours.   4. DN is best combined with other physical therapy such as strengthening, stretching, and other therapies.    IF you received an x-ray today, you will receive an invoice from [facility]. Please contact [facility] at [phone] with questions or concerns regarding your invoice.   IF you received labwork today, you will receive an invoice from [place]. Please contact [facility] at [phone] with questions or concerns regarding your invoice.   Our billing staff will not be able to assist you with questions regarding bills from these companies.  You will be contacted with the lab results as soon as they are available. The fastest way to get your results is to activate your  My Chart account. Instructions are located on the last page of this paperwork. If you have not heard from [place] regarding the results in 2 weeks, please contact this office.

## 2017-11-28 LAB — CMP14+EGFR
ALT: 20 IU/L (ref 0–32)
AST: 19 IU/L (ref 0–40)
Albumin/Globulin Ratio: 2 (ref 1.2–2.2)
Albumin: 4.9 g/dL (ref 3.5–5.5)
Alkaline Phosphatase: 54 IU/L (ref 39–117)
BUN/Creatinine Ratio: 14 (ref 9–23)
BUN: 10 mg/dL (ref 6–20)
Bilirubin Total: 0.2 mg/dL (ref 0.0–1.2)
CO2: 25 mmol/L (ref 20–29)
Calcium: 10 mg/dL (ref 8.7–10.2)
Chloride: 103 mmol/L (ref 96–106)
Creatinine, Ser: 0.74 mg/dL (ref 0.57–1.00)
GFR calc Af Amer: 119 mL/min/{1.73_m2} (ref >59)
GFR calc non Af Amer: 103 mL/min/{1.73_m2} (ref >59)
Globulin, Total: 2.5 g/dL (ref 1.5–4.5)
Glucose: 93 mg/dL (ref 65–99)
Potassium: 4.6 mmol/L (ref 3.5–5.2)
Sodium: 143 mmol/L (ref 134–144)
Total Protein: 7.4 g/dL (ref 6.0–8.5)

## 2017-11-28 LAB — CBC
Hematocrit: 37.1 % (ref 34.0–46.6)
Hemoglobin: 12.4 g/dL (ref 11.1–15.9)
MCH: 29.3 pg (ref 26.6–33.0)
MCHC: 33.4 g/dL (ref 31.5–35.7)
MCV: 88 fL (ref 79–97)
Platelets: 307 10*3/uL (ref 150–450)
RBC: 4.23 x10E6/uL (ref 3.77–5.28)
RDW: 12.5 % (ref 12.3–15.4)
WBC: 6.8 10*3/uL (ref 3.4–10.8)

## 2017-11-28 LAB — IRON,TIBC AND FERRITIN PANEL
Ferritin: 12 ng/mL — ABNORMAL LOW (ref 15–150)
Iron Saturation: 7 % — CL (ref 15–55)
Iron: 27 ug/dL (ref 27–159)
Total Iron Binding Capacity: 393 ug/dL (ref 250–450)
UIBC: 366 ug/dL (ref 131–425)

## 2017-12-02 ENCOUNTER — Telehealth: Payer: Self-pay | Admitting: Physician Assistant

## 2017-12-02 NOTE — Telephone Encounter (Signed)
Copied from [place] [id number]. Topic: General - Other >> Dec 02, 2017 11:20 AM Oneta Rack wrote: Relation to pt: self  Call back number: [phone]   Reason for call:  Patient inquiring about lab results, please advise

## 2017-12-02 NOTE — Telephone Encounter (Signed)
Patient advised she will be informed when doctor releases labs

## 2017-12-03 ENCOUNTER — Encounter: Payer: Self-pay | Admitting: Physician Assistant

## 2017-12-03 NOTE — Telephone Encounter (Signed)
Patient called and states she has not seen her lab results on MyChart . I made her aware the provider has not released them yet. Patient is wanting a call back with these results. CB# [phone].

## 2017-12-07 ENCOUNTER — Other Ambulatory Visit: Payer: Self-pay | Admitting: Physician Assistant

## 2017-12-07 DIAGNOSIS — D509 Iron deficiency anemia, unspecified: Secondary | ICD-10-CM

## 2017-12-07 MED ORDER — FERROUS SULFATE 325 (65 FE) MG PO TABS: ORAL_TABLET | ORAL | 3 refills | Status: DC

## 2017-12-07 NOTE — Progress Notes (Signed)
Please call pt: Looks like your iron levels are low again. Start back on iron supplements (has been sent to pharmacy). Take this three times/day on Monday, Wednesday and Friday.  - Take with a half glass of orange juice or 250 mg ascorbic acid tablet.  - Come back for repeat labs in 3 months. Thank you!

## 2017-12-09 ENCOUNTER — Ambulatory Visit: Payer: No Typology Code available for payment source | Admitting: Physician Assistant

## 2018-02-11 ENCOUNTER — Ambulatory Visit: Payer: Self-pay | Admitting: Physician Assistant

## 2018-02-11 NOTE — Telephone Encounter (Signed)
Pt has had 7 watery diarrhea stools today.. Pt having upper abdominal pain (under the ribs) and rated her pain a 2 out of 10. Diarrhea began this am. This is the second time she has had diarrhea. Pt denies nausea and vomiting along with the  watery bowel movements. Pt is tolerating water and denies feeling dehydrated. Pt last voided this am . 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  Care advice given to pt. Offered to make an appointment but pt stated that she will have to check with her husbands schedule and will call back for appointment.   Reason for Disposition . Abdominal pain  (Exception: Pain clears with each passage of diarrhea stool)  Answer Assessment - Initial Assessment Questions 1. DIARRHEA SEVERITY: "How bad is the diarrhea?" "How many extra stools have you had in the past 24 hours than normal?"    - NO DIARRHEA (SCALE 0)   - MILD (SCALE 1-3): Few loose or mushy BMs; increase of 1-3 stools over normal daily number of stools; mild increase in ostomy output.   -  MODERATE (SCALE 4-7): Increase of 4-6 stools daily over normal; moderate increase in ostomy output. * SEVERE (SCALE 8-10; OR 'WORST POSSIBLE'): Increase of 7 or more stools daily over normal; moderate increase in ostomy output; incontinence.     moderate 2. ONSET: "When did the diarrhea begin?"      0700 this am  3. BM CONSISTENCY: "How loose or watery is the diarrhea?"      watery 4. VOMITING: "Are you also vomiting?" If so, ask: "How many times in the past 24 hours?"      no 5. ABDOMINAL PAIN: "Are you having any abdominal pain?" If yes: "What does it feel like?" (e.g., crampy, dull, intermittent, constant)      Cramps under rib cage at the top of stomach 6. ABDOMINAL PAIN SEVERITY: If present, ask: "How bad is the pain?"  (e.g., Scale 1-10; mild, moderate, or severe)   - MILD (1-3): doesn't interfere with normal activities, abdomen soft and not tender to touch    - MODERATE (4-7): interferes  with normal activities or awakens from sleep, tender to touch    - SEVERE (8-10): excruciating pain, doubled over, unable to do any normal activities   mild 7. ORAL INTAKE: If vomiting, "Have you been able to drink liquids?" "How much fluids have you had in the past 24 hours?"     Drinking water 8. HYDRATION: "Any signs of dehydration?" (e.g., dry mouth [not just dry lips], too weak to stand, dizziness, new weight loss) "When did you last urinate?"     No-last urinated this am  9. 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 ANTIBIOTIC USE: "Are you taking antibiotics now or have you taken antibiotics in the past 2 months?"       no 11. OTHER SYMPTOMS: "Do you have any other symptoms?" (e.g., fever, blood in stool)       no 12. PREGNANCY: "Is there any chance you are pregnant?" "When was your last menstrual period?"       No- justin finished period  Protocols used: DIARRHEA-A-AH

## 2018-08-13 ENCOUNTER — Encounter: Payer: Self-pay | Admitting: Internal Medicine

## 2018-08-21 IMAGING — DX DG CHEST 2V
2 series · 2 of 2 positions shown · non-contrast
Comparison: None in PACs

CLINICAL DATA: Chest pain

EXAM:
CHEST  2 VIEW

[chest pa]
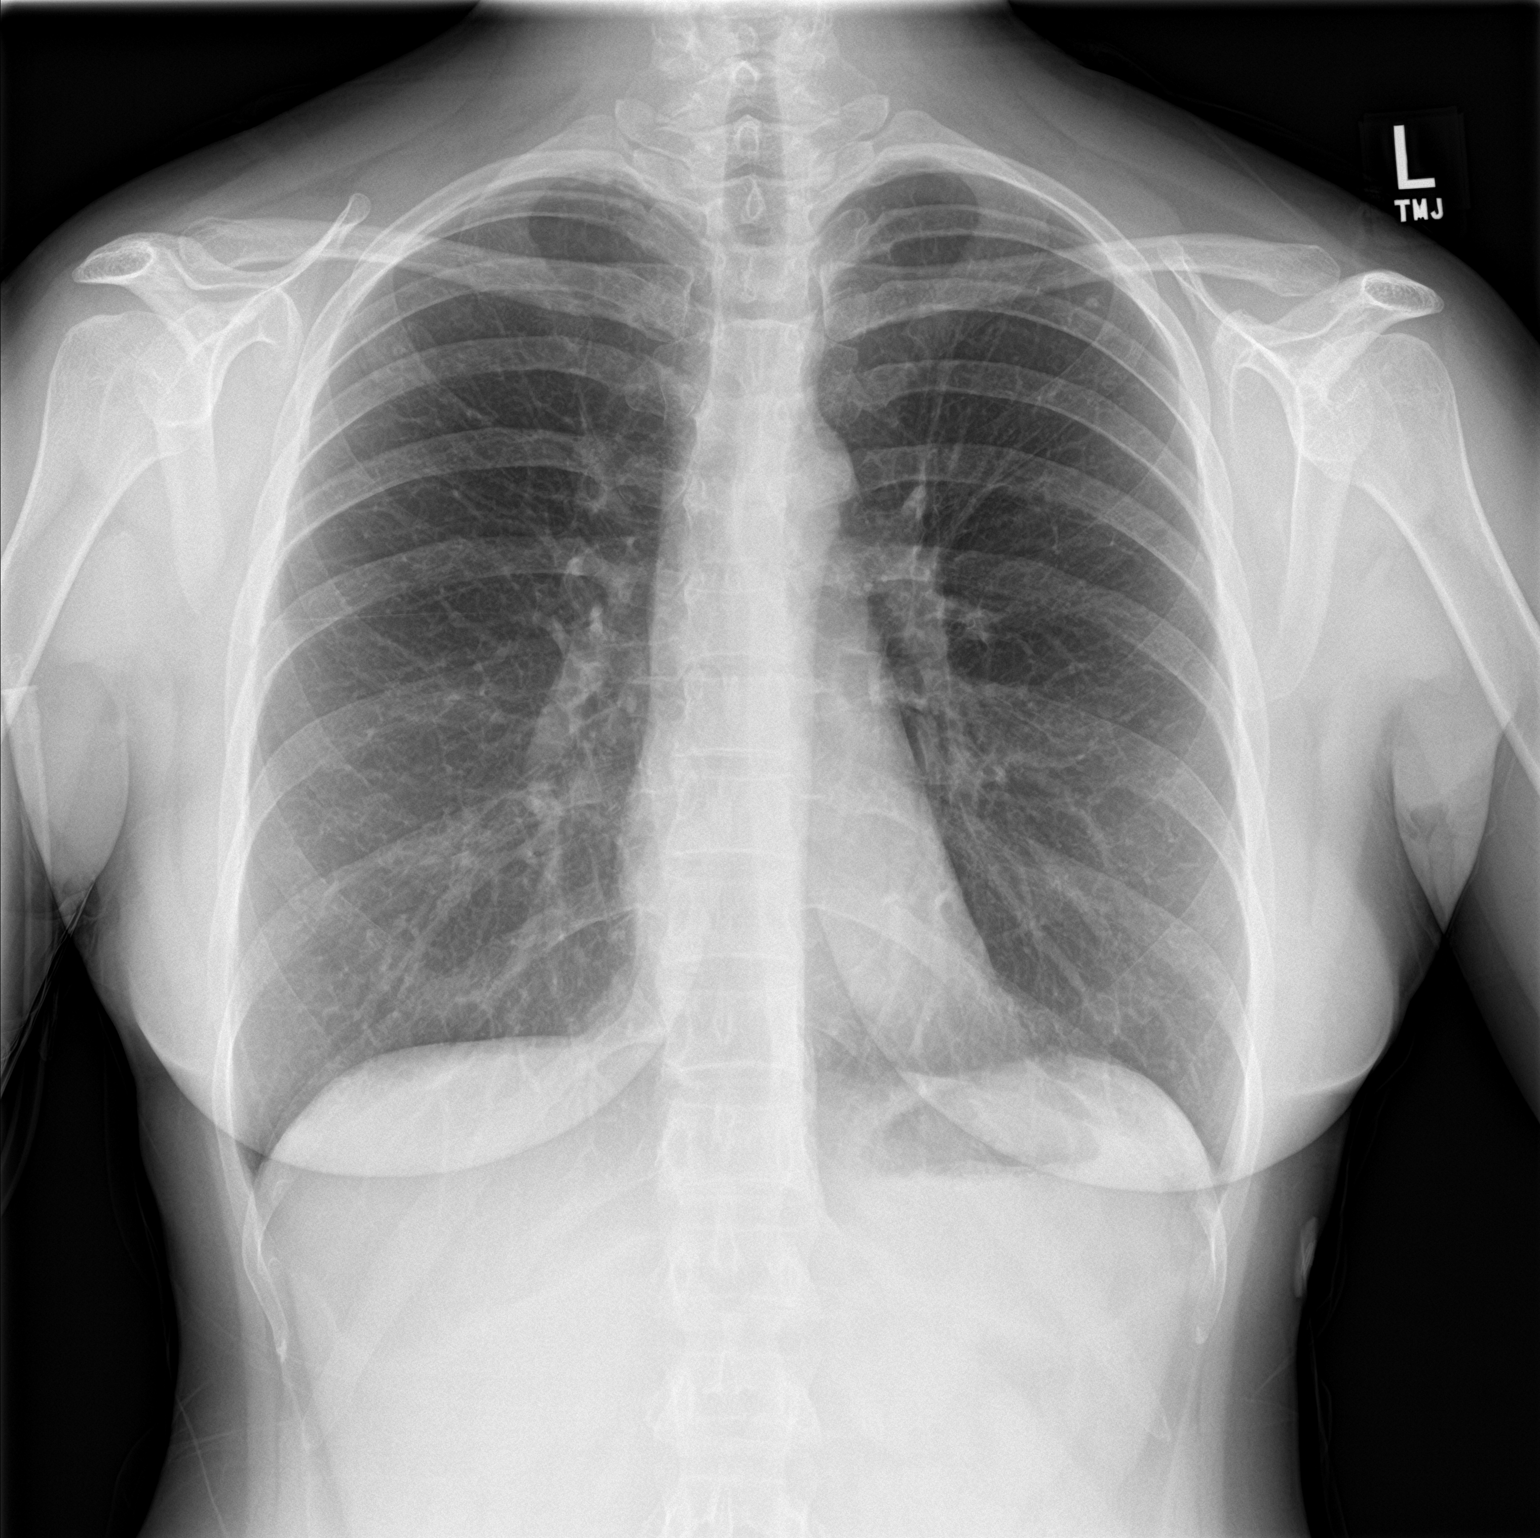

[chest lat]
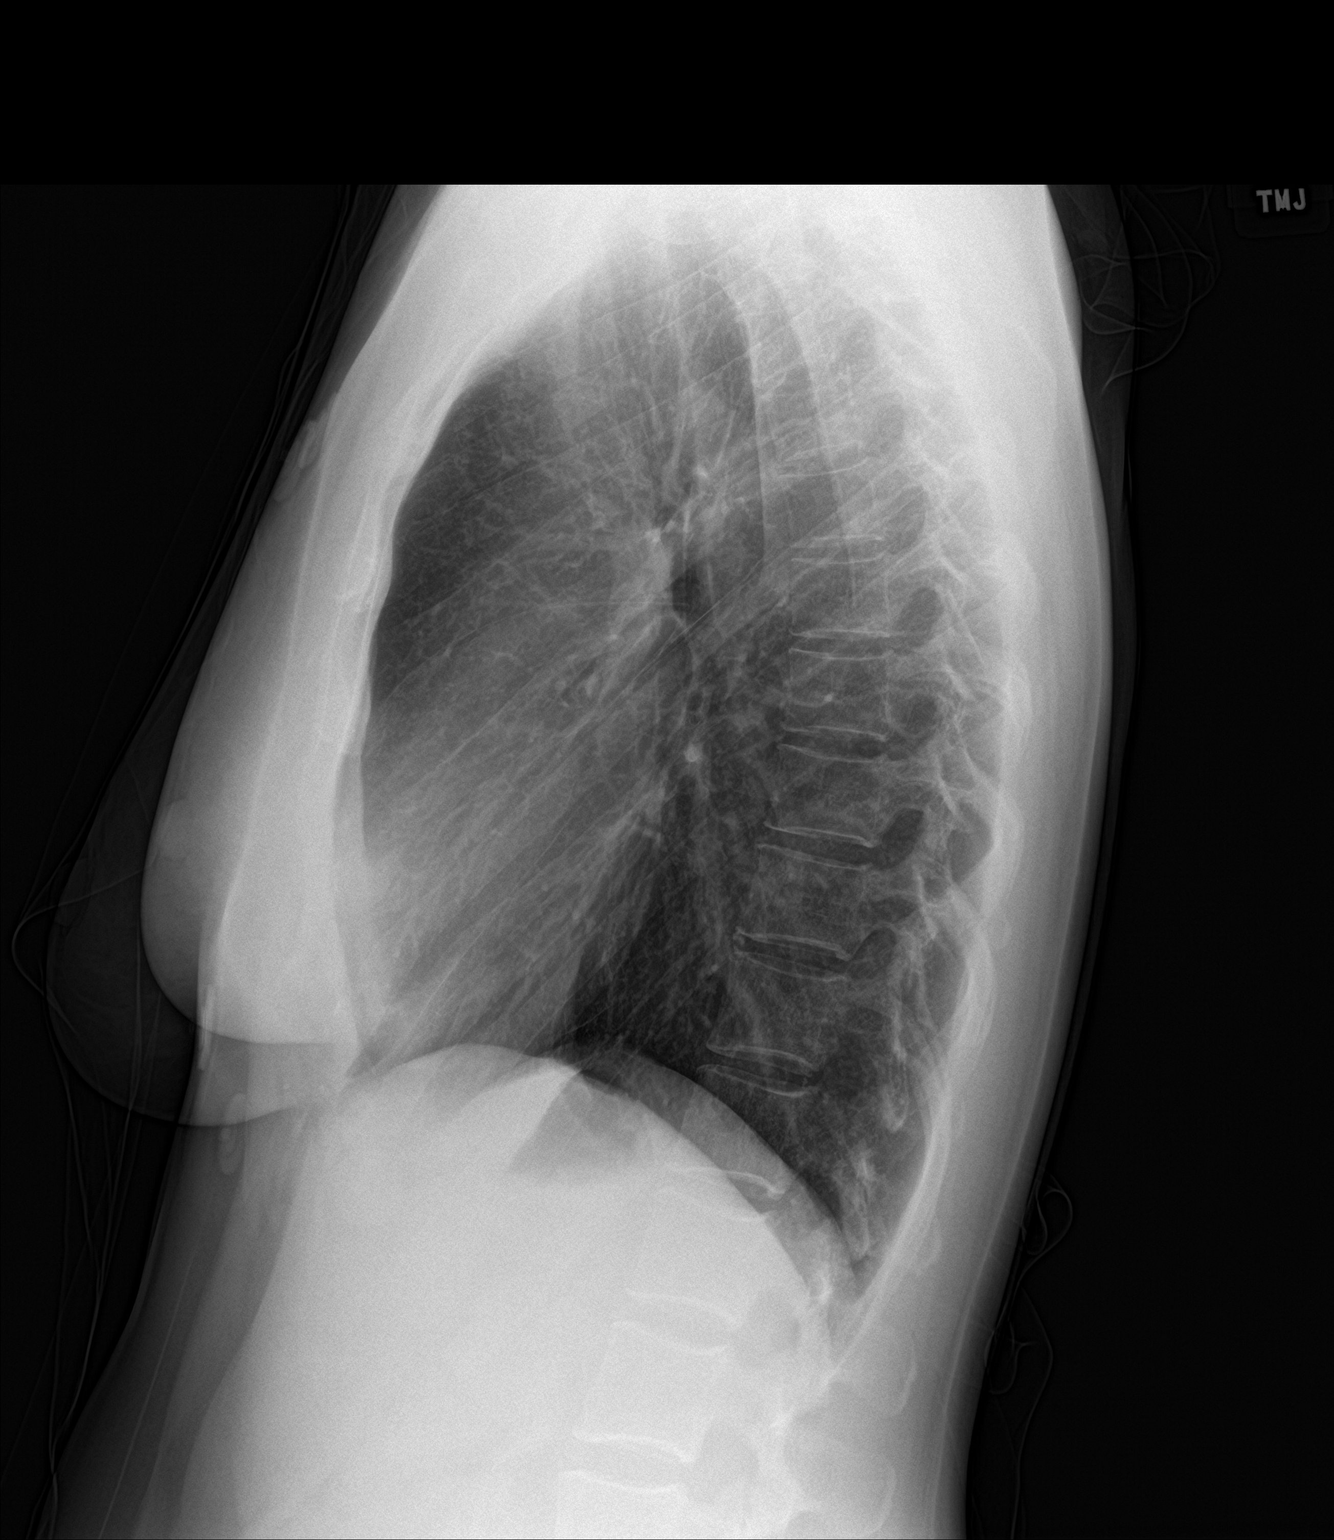

[2 of 2 positions shown; findings below may reference images not displayed]

FINDINGS: The lungs are well-expanded. There is no pneumothorax or pleural
effusion. There is an approximately 3 mm diameter calcified nodule
in the left pulmonary apex with a slightly smaller nodule in the
right apex. There is no infiltrate or atelectasis. The heart and
mediastinal structures are normal. The bony thorax exhibits no acute
abnormality.
IMPRESSION: There is no acute cardiopulmonary abnormality. There are findings
that suggest previous granulomatous infection. If the patient's
symptoms persist and remain unexplained, CT scanning may be a useful
next imaging step.

## 2018-12-24 IMAGING — MG 2D DIGITAL DIAGNOSTIC BILATERAL MAMMOGRAM WITH CAD AND ADJUNCT T
4 series · 4 of 12 positions shown · non-contrast
Comparison: August 09, 2015

CLINICAL DATA: 36-year-old patient with recent left breast pain, in
the 5 o'clock region, with some shooting pain. She denies any skin
changes or nipple discharge. She does not palpate a lump. She states
that she has had mastitis in the remote past, but no concerns for
mastitis currently.

EXAM:
2D DIGITAL DIAGNOSTIC BILATERAL MAMMOGRAM WITH CAD AND ADJUNCT TOMO
ULTRASOUND LEFT BREAST

[L CC]
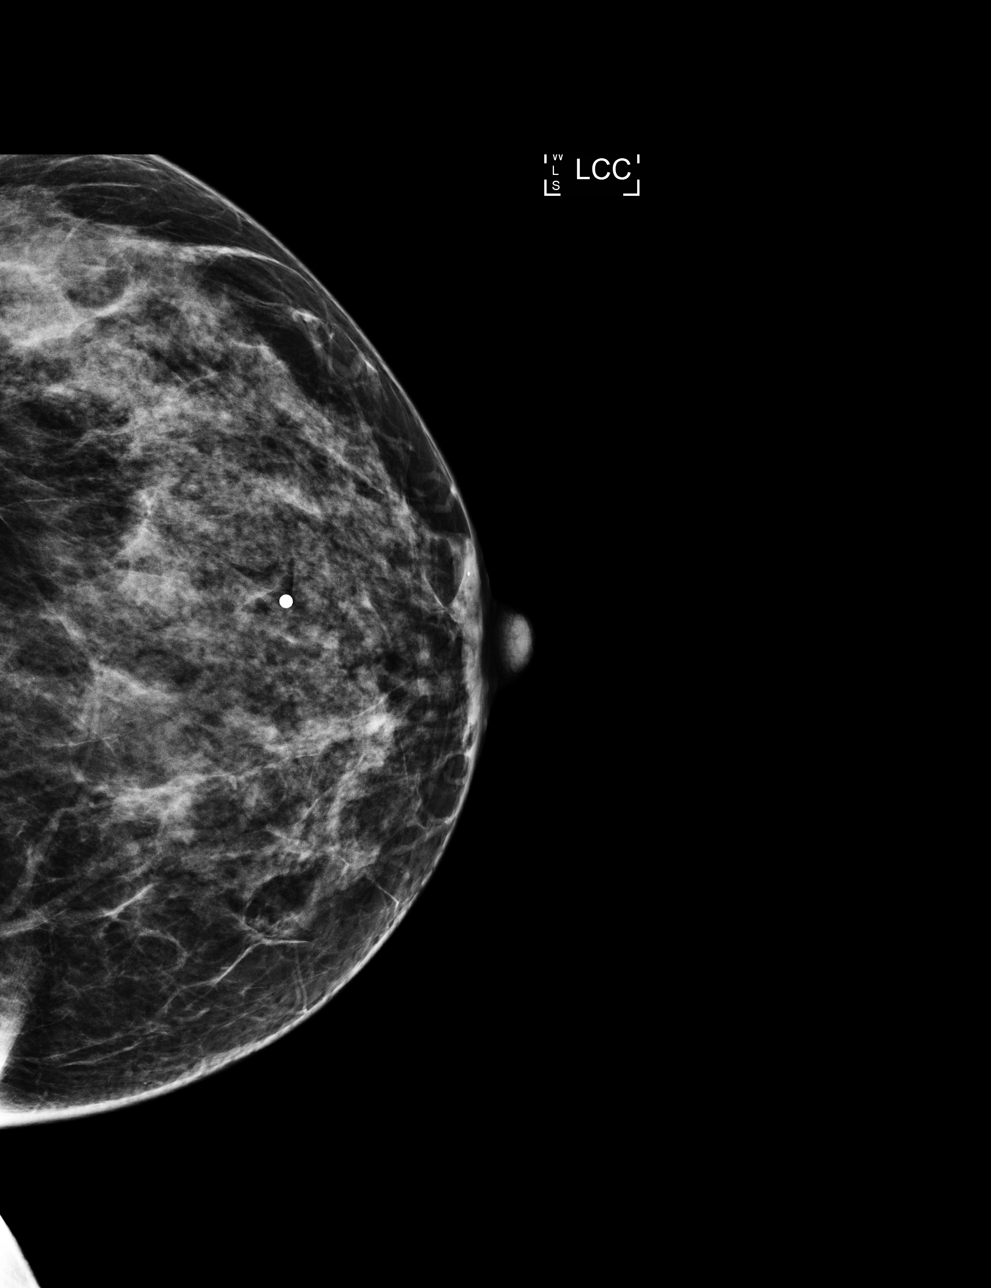

[L CC synth-2D]
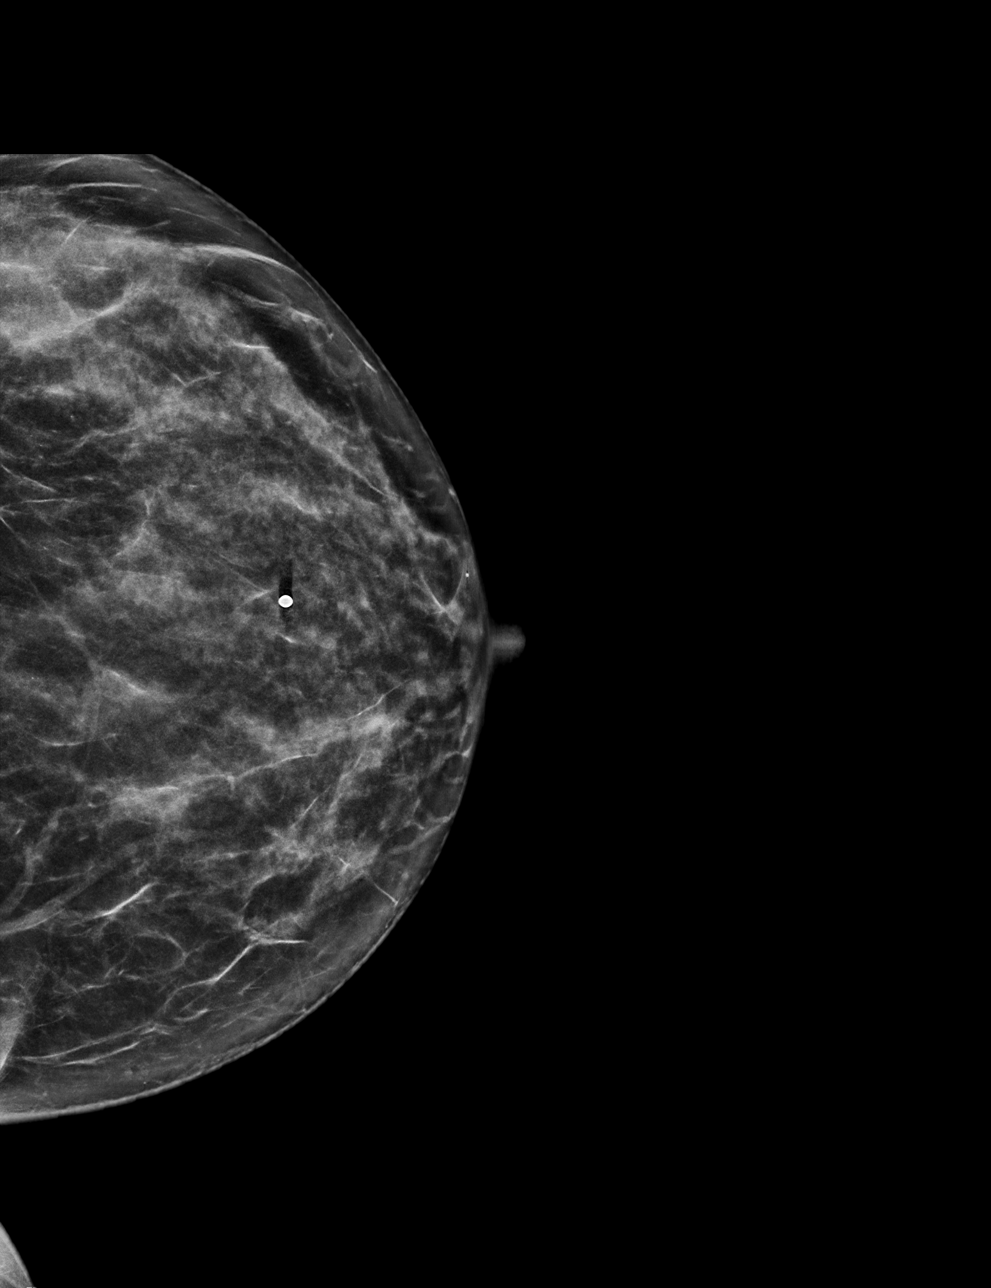

[R MLO tomo · tomo slice 29/56.0]
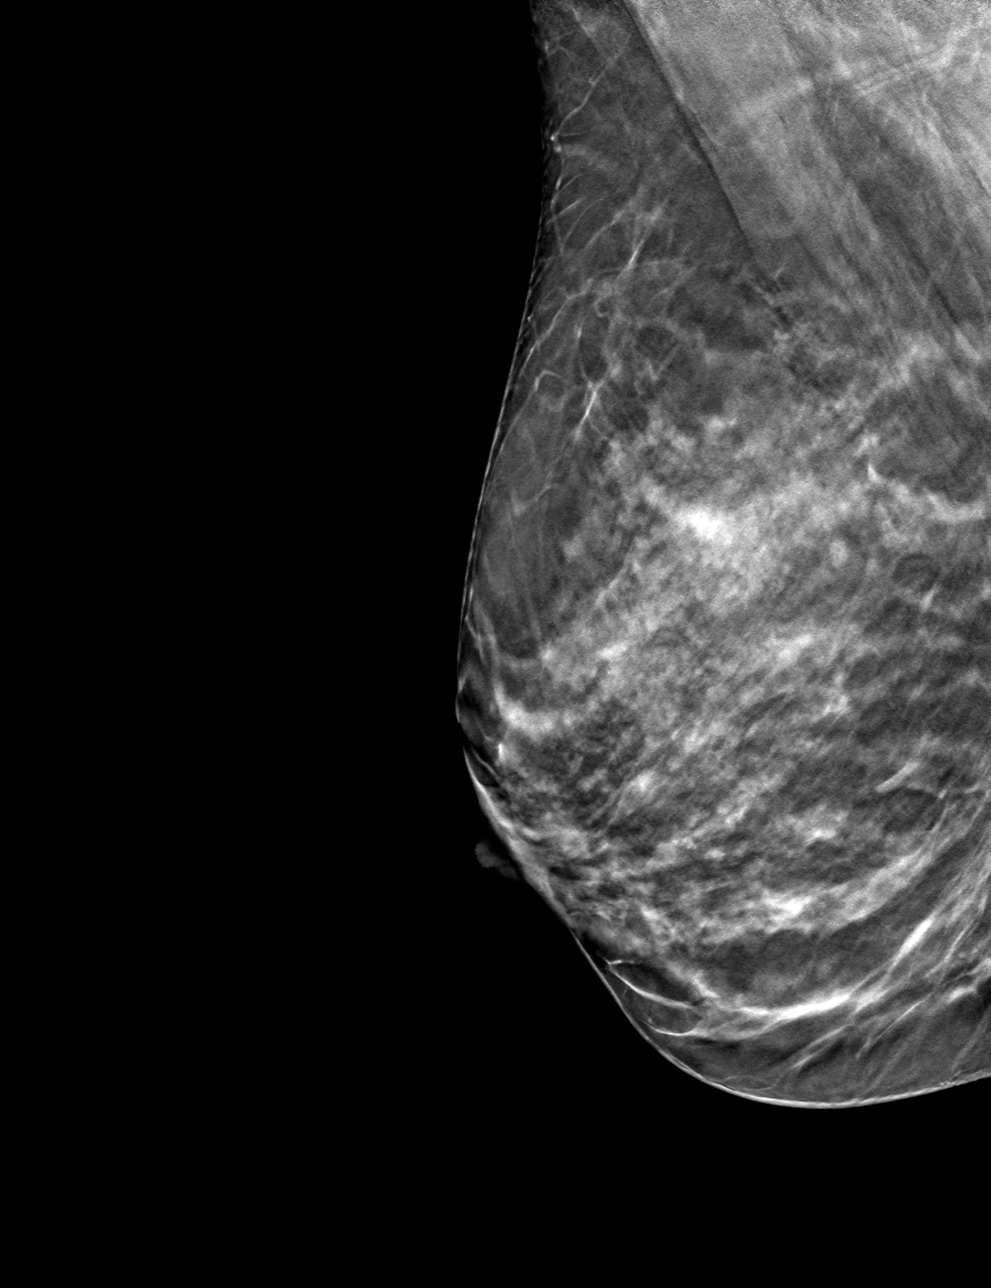

[L ML tomo · tomo slice 22/43.0]
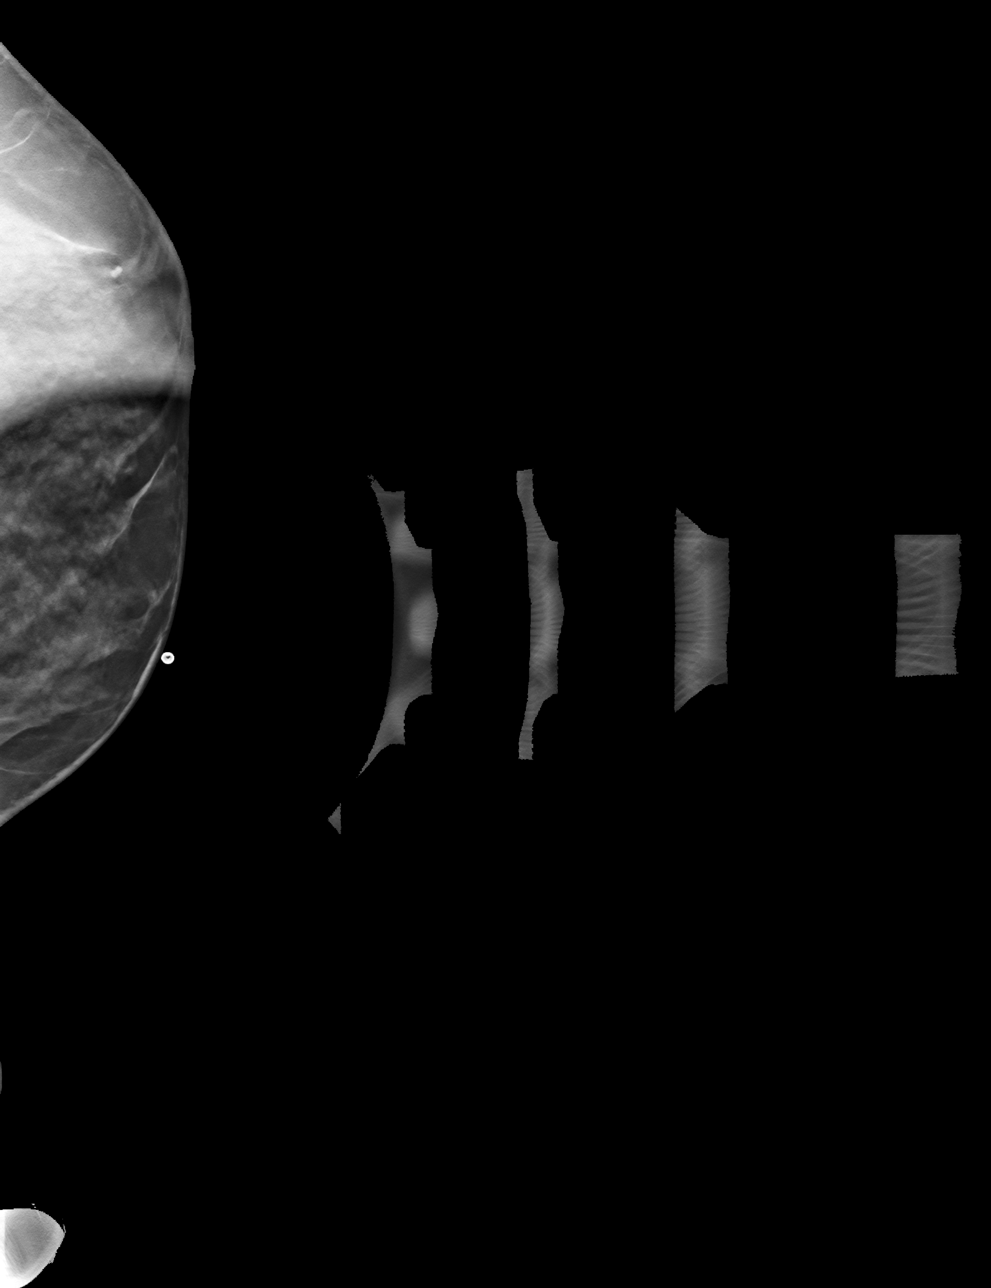

[4 of 12 positions shown; findings below may reference images not displayed]

ACR Breast Density Category c: The breast tissue is heterogeneously
dense, which may obscure small masses.
FINDINGS: No mass, architectural distortion, or suspicious microcalcification
is identified in either breast to suggest malignancy. Spot
tangential view of the region of recent pain in the left breast is
negative.

Mammographic images were processed with CAD.

On physical exam, I do not palpate a lump or thickening in the
inferior left breast. The skin appears normal P

Targeted ultrasound is performed, showing normal fibroglandular
tissue. No solid or cystic mass or abnormal shadowing is identified
in the lower outer quadrant of the left breast to suggest
malignancy.
IMPRESSION: No evidence of malignancy in either breast.

RECOMMENDATION:
Screening mammogram at age 39 unless there are persistent or
intervening clinical concerns. (Code:[id number]-04N)

I have discussed the findings and recommendations with the patient.
Results were also provided in writing at the conclusion of the
visit. If applicable, a reminder letter will be sent to the patient
regarding the next appointment.

BI-RADS CATEGORY  1: Negative.

## 2019-08-09 ENCOUNTER — Ambulatory Visit: Payer: No Typology Code available for payment source | Attending: Internal Medicine

## 2019-08-09 ENCOUNTER — Ambulatory Visit: Payer: BLUE CROSS/BLUE SHIELD

## 2019-08-09 DIAGNOSIS — Z23 Encounter for immunization: Secondary | ICD-10-CM

## 2019-08-09 NOTE — Progress Notes (Signed)
   Covid-19 Vaccination Clinic  Name:  Tiffany Carter    MRN: [id number] DOB: 04-03-1981  08/09/2019  [name] was observed post Covid-19 immunization for 15 minutes without incident. She was provided with Vaccine Information Sheet and instruction to access the V-Safe system.   [name] was instructed to call 911 with any severe reactions post vaccine: Marland Kitchen Difficulty breathing  . Swelling of face and throat  . A fast heartbeat  . A bad rash all over body  . Dizziness and weakness   Immunizations Administered    Name Date Dose VIS Date Route   Pfizer COVID-19 Vaccine 08/09/2019 10:36 AM 0.3 mL 05/15/2019 Intramuscular   Manufacturer: [place]   Lot: [id number]   [place]: [id number]

## 2019-09-09 ENCOUNTER — Ambulatory Visit: Payer: No Typology Code available for payment source

## 2020-01-05 ENCOUNTER — Telehealth: Payer: Self-pay | Admitting: Gastroenterology

## 2020-01-05 NOTE — Telephone Encounter (Signed)
Hi Dr. Tarri Glenn,    This patient called requesting to transfer care from Dr. Liliane Channel office due to scheduling conflict and her availability at work. Patients records are available through care every where. Had colonoscopy done in 2017. Please review and advise on scheduling.   Thank you

## 2020-01-06 NOTE — Telephone Encounter (Signed)
Called patient to advise left voicemail. °

## 2020-01-06 NOTE — Telephone Encounter (Signed)
I would be happy to see her. Thank you.

## 2020-01-07 ENCOUNTER — Encounter: Payer: Self-pay | Admitting: Gastroenterology

## 2020-03-07 ENCOUNTER — Ambulatory Visit (AMBULATORY_SURGERY_CENTER): Payer: Self-pay | Admitting: *Deleted

## 2020-03-07 ENCOUNTER — Other Ambulatory Visit: Payer: Self-pay

## 2020-03-07 VITALS — Ht 64.0 in | Wt 146.0 lb

## 2020-03-07 DIAGNOSIS — Z800 Family history of malignant neoplasm of digestive organs: Secondary | ICD-10-CM

## 2020-03-07 MED ORDER — SUTAB 1479-225-188 MG PO TABS
1.0000 | ORAL_TABLET | ORAL | 0 refills | Status: DC
Start: 2020-03-07 — End: 2020-03-28

## 2020-03-07 NOTE — Progress Notes (Signed)
Patient is here in-person for PV. Patient denies any allergies to eggs or soy. Patient denies any problems with anesthesia/sedation. PONV. Patient denies any oxygen use at home. Patient denies taking any diet/weight loss medications or blood thinners. Patient is not being treated for MRSA or C-diff. Patient is aware of our care-partner policy and [id number] safety protocol. EMMI education assisgned to the patient for the procedure, sent to MyChart.   COVID-19 vaccines completed on 09/2019, per patient.   Prep Prescription coupon given to the patient.

## 2020-03-08 ENCOUNTER — Encounter: Payer: Self-pay | Admitting: Gastroenterology

## 2020-03-28 ENCOUNTER — Encounter: Payer: Self-pay | Admitting: Gastroenterology

## 2020-03-28 ENCOUNTER — Other Ambulatory Visit: Payer: Self-pay

## 2020-03-28 ENCOUNTER — Ambulatory Visit (AMBULATORY_SURGERY_CENTER): Payer: PRIVATE HEALTH INSURANCE | Admitting: Gastroenterology

## 2020-03-28 VITALS — BP 121/80 | HR 78 | Temp 98.7°F | Resp 12 | Ht 64.0 in | Wt 146.0 lb

## 2020-03-28 DIAGNOSIS — K635 Polyp of colon: Secondary | ICD-10-CM | POA: Diagnosis not present

## 2020-03-28 DIAGNOSIS — D124 Benign neoplasm of descending colon: Secondary | ICD-10-CM

## 2020-03-28 DIAGNOSIS — D122 Benign neoplasm of ascending colon: Secondary | ICD-10-CM

## 2020-03-28 DIAGNOSIS — Z1211 Encounter for screening for malignant neoplasm of colon: Secondary | ICD-10-CM | POA: Diagnosis not present

## 2020-03-28 DIAGNOSIS — Z800 Family history of malignant neoplasm of digestive organs: Secondary | ICD-10-CM | POA: Diagnosis not present

## 2020-03-28 MED ORDER — SODIUM CHLORIDE 0.9 % IV SOLN: 500.0000 mL | Freq: Once | INTRAVENOUS | Status: AC

## 2020-03-28 NOTE — Progress Notes (Signed)
Report given to PACU, vss 

## 2020-03-28 NOTE — Progress Notes (Signed)
Called to room to assist during endoscopic procedure.  Patient ID and intended procedure confirmed with present staff. Received instructions for my participation in the procedure from the performing physician.  

## 2020-03-28 NOTE — Patient Instructions (Signed)
Information on polyps given to you today.  Await pathology results.  Resume previous diet and medications.  YOU HAD AN ENDOSCOPIC PROCEDURE TODAY AT THE [facility]:   Refer to the procedure report that was given to you for any specific questions about what was found during the examination.  If the procedure report does not answer your questions, please call your gastroenterologist to clarify.  If you requested that your care partner not be given the details of your procedure findings, then the procedure report has been included in a sealed envelope for you to review at your convenience later.  YOU SHOULD EXPECT: Some feelings of bloating in the abdomen. Passage of more gas than usual.  Walking can help get rid of the air that was put into your GI tract during the procedure and reduce the bloating. If you had a lower endoscopy (such as a colonoscopy or flexible sigmoidoscopy) you may notice spotting of blood in your stool or on the toilet paper. If you underwent a bowel prep for your procedure, you may not have a normal bowel movement for a few days.  Please Note:  You might notice some irritation and congestion in your nose or some drainage.  This is from the oxygen used during your procedure.  There is no need for concern and it should clear up in a day or so.  SYMPTOMS TO REPORT IMMEDIATELY:   Following lower endoscopy (colonoscopy or flexible sigmoidoscopy):  Excessive amounts of blood in the stool  Significant tenderness or worsening of abdominal pains  Swelling of the abdomen that is new, acute  Fever of 100F or higher   For urgent or emergent issues, a gastroenterologist can be reached at any hour by calling [phone]. Do not use MyChart messaging for urgent concerns.    DIET:  We do recommend a small meal at first, but then you may proceed to your regular diet.  Drink plenty of fluids but you should avoid alcoholic beverages for 24 hours.  ACTIVITY:  You should  plan to take it easy for the rest of today and you should NOT DRIVE or use heavy machinery until tomorrow (because of the sedation medicines used during the test).    FOLLOW UP: Our staff will call the number listed on your records 48-72 hours following your procedure to check on you and address any questions or concerns that you may have regarding the information given to you following your procedure. If we do not reach you, we will leave a message.  We will attempt to reach you two times.  During this call, we will ask if you have developed any symptoms of COVID 19. If you develop any symptoms (ie: fever, flu-like symptoms, shortness of breath, cough etc.) before then, please call [phone].  If you test positive for Covid 19 in the 2 weeks post procedure, please call and report this information to us.    If any biopsies were taken you will be contacted by phone or by letter within the next 1-3 weeks.  Please call us at [phone] if you have not heard about the biopsies in 3 weeks.    SIGNATURES/CONFIDENTIALITY: You and/or your care partner have signed paperwork which will be entered into your electronic medical record.  These signatures attest to the fact that that the information above on your After Visit Summary has been reviewed and is understood.  Full responsibility of the confidentiality of this discharge information lies with you and/or your care-partner. 

## 2020-03-28 NOTE — Progress Notes (Signed)
CW -vs in adm  Pt's states no medical or surgical changes since previsit or office visit.

## 2020-03-28 NOTE — Op Note (Signed)
[place] Patient Name: Tiffany Carter Procedure Date: 03/28/2020 2:03 PM MRN: [id number] Endoscopist: Thornton Park MD, MD Age: 39 Referring MD:  Date of Birth: Apr 09, 1981 Gender: Female Account #: [id number] Procedure:                Colonoscopy Indications:              Screening in patient at increased risk: Family                            history of 1st-degree relative with colorectal                            cancer before age 61 years                           Mother with colon cancer at age 57                           Last colonoscopy with Dr. Allyn Kenner Medicines:                Monitored Anesthesia Care Procedure:                Pre-Anesthesia Assessment:                           - Prior to the procedure, a History and Physical                            was performed, and patient medications and                            allergies were reviewed. The patient's tolerance of                            previous anesthesia was also reviewed. The risks                            and benefits of the procedure and the sedation                            options and risks were discussed with the patient.                            All questions were answered, and informed consent                            was obtained. Prior Anticoagulants: The patient has                            taken no previous anticoagulant or antiplatelet                            agents. ASA Grade Assessment: I - A normal, healthy  patient. After reviewing the risks and benefits,                            the patient was deemed in satisfactory condition to                            undergo the procedure.                           After obtaining informed consent, the colonoscope                            was passed under direct vision. Throughout the                            procedure, the patient's blood pressure, pulse, and                             oxygen saturations were monitored continuously. The                            Colonoscope was introduced through the anus and                            advanced to the 3 cm into the ileum. The                            colonoscopy was performed without difficulty. The                            patient tolerated the procedure well. The quality                            of the bowel preparation was excellent. The                            terminal ileum, ileocecal valve, appendiceal                            orifice, and rectum were photographed. Scope In: 2:14:26 PM Scope Out: 2:32:20 PM Scope Withdrawal Time: 0 hours 14 minutes 57 seconds  Total Procedure Duration: 0 hours 17 minutes 54 seconds  Findings:                 The perianal and digital rectal examinations were                            normal.                           Anal papilla(e) were hypertrophied.                           A 2 mm polyp was found in the descending colon. The  polyp was sessile. The polyp was removed with a                            cold snare. Resection and retrieval were complete.                            Estimated blood loss was minimal.                           A 5 mm polyp was found in the ascending colon. The                            polyp was flat. The polyp was removed with a cold                            snare. Resection and retrieval were complete.                            Estimated blood loss was minimal.                           The exam was otherwise without abnormality on                            direct and retroflexion views. Complications:            No immediate complications. Estimated blood loss:                            Minimal. Estimated Blood Loss:     Estimated blood loss was minimal. Impression:               - Anal papilla(e) were hypertrophied.                           - One 2 mm polyp in the descending colon, removed                             with a cold snare. Resected and retrieved.                           - One 5 mm polyp in the ascending colon, removed                            with a cold snare. Resected and retrieved.                           - The examination was otherwise normal on direct                            and retroflexion views. Recommendation:           - Patient has a contact number available for  emergencies. The signs and symptoms of potential                            delayed complications were discussed with the                            patient. Return to normal activities tomorrow.                            Written discharge instructions were provided to the                            patient.                           - Resume previous diet.                           - Continue present medications.                           - Await pathology results.                           - Repeat colonoscopy in 5 years for surveillance,                            earlier with new symptoms.                           - Emerging evidence supports eating a diet of                            fruits, vegetables, grains, calcium, and yogurt                            while reducing red meat and alcohol may reduce the                            risk of colon cancer.                           - Thank you for allowing me to be involved in your                            colon cancer prevention. Thornton Park MD, MD 03/28/2020 2:41:59 PM This report has been signed electronically.

## 2020-03-30 ENCOUNTER — Telehealth: Payer: Self-pay

## 2020-03-30 NOTE — Telephone Encounter (Signed)
Left message on follow up call. 

## 2020-03-30 NOTE — Telephone Encounter (Signed)
  Follow up Call-  Call back number 03/28/2020  Post procedure Call Back phone  # [phone]  Permission to leave phone message Yes  Some recent data might be hidden     Patient questions:  Do you have a fever, pain , or abdominal swelling? No. Pain Score  0 *  Have you tolerated food without any problems? Yes.    Have you been able to return to your normal activities? Yes.    Do you have any questions about your discharge instructions: Diet   No. Medications  No. Follow up visit  No.  Do you have questions or concerns about your Care? No.  Actions: * If pain score is 4 or above: No action needed, pain <4. 1. Have you developed a fever since your procedure? no  2.   Have you had an respiratory symptoms (SOB or cough) since your procedure? no  3.   Have you tested positive for COVID 19 since your procedure no  4.   Have you had any family members/close contacts diagnosed with the COVID 19 since your procedure?  no   If yes to any of these questions please route to Joylene John, RN and Joella Prince, RN

## 2020-04-01 ENCOUNTER — Encounter: Payer: Self-pay | Admitting: Gastroenterology

## 2020-04-04 ENCOUNTER — Telehealth: Payer: Self-pay | Admitting: Gastroenterology

## 2020-04-04 NOTE — Telephone Encounter (Signed)
Left message for pt to call back if she would like to come in 04/07/20@1 :30PM.

## 2020-04-04 NOTE — Telephone Encounter (Signed)
Pt reports she had a colon done on 10/25 and on 10/27 she started having pelvic pain, describes the pain is right below her C-Section scar. States it is a pulling/pinching pain that is better at night but worse during the day. She thought it might have been ovulation pain but it was not. States she is not having any burning with urination but is going frequently but she is also drinking a lot of water. Please advise.

## 2020-04-04 NOTE — Telephone Encounter (Signed)
I am sorry to hear this. Unlikely to be related to her procedure, but, I understand her concerns. Is there any availability in the schedule this week with me, an APP, or Dr. Carlean Purl to have her seen and evaluated? If not, could proceed with CT abd/pelvis with contrast and office follow-up after that. But, I think it would be more helpful for an inperson evaluation. Thank you.

## 2020-04-04 NOTE — Telephone Encounter (Signed)
Thank you. I would like to double book at this time for this one exception. Thank you.

## 2020-04-04 NOTE — Telephone Encounter (Signed)
There are no available appts with an app, Dr. Tarri Glenn or Dr. Carlean Purl this week. Discussed  Dr. Tarri Glenn recommendations with pt and she is not interested in having a CT scan done at this time. States if it is unlikely related to procedure she will follow-up with her PCP. Dr. Tarri Glenn aware.

## 2020-04-04 NOTE — Telephone Encounter (Signed)
I think that's a great idea. I would be happy to try to see her 04/07/20 at 1:30 (discussed with clinic staff who agreed with adding on a visit at that time). Thank you.

## 2020-04-04 NOTE — Telephone Encounter (Signed)
There is already a pt scheduled at 1:30pm on 04/07/20.

## 2020-04-06 ENCOUNTER — Telehealth: Payer: Self-pay

## 2020-04-06 NOTE — Telephone Encounter (Signed)
Pt never called back.

## 2020-04-06 NOTE — Telephone Encounter (Signed)
Thank you. I would like to offer her an appointment if her symptoms have continued. Appointment not necessary if they have resolved. Thank you.

## 2020-04-06 NOTE — Telephone Encounter (Signed)
Per Dr. Tarri Glenn request, I called pt to f/u re: concerns s/p colonoscopy. Per Dr. Tarri Glenn, she would also like to schedule her to be seen 04/07/20 if pt is able. LVM requesting returned call. In addition, My Chart message has been sent. Dr. Tarri Glenn made aware of my attempts. Will await response from pt.

## 2020-04-07 NOTE — Telephone Encounter (Signed)
Follow up:  Following message received from pt via My Chart:  Tiffany Carter, I received your first message when you said I could call if I wanted the appointment, but I didn't call back because I went to my primary care physician as we discussed, and was diagnosed with a UTI. I'm on antibiotics and am slowly feeling better. I worked 2-8pm without a break yesterday, so that's why you couldn't reach me! Anzal  Following response sent to pt:  Hi Tiffany Carter,  Thank you for responding. I am happy to hear that you are feeling better. I will forward this to Dr. Tarri Glenn to make her aware that your concern has been addressed and seems to be resolving. If you have any further concerns, please do not hesitate to reach out to [place]. Have a wonderful day.  Aleatha Borer, LPN  Routing this encounter to Dr. Tarri Glenn for continuity of care.

## 2020-04-07 NOTE — Telephone Encounter (Signed)
I am glad that she's feeling better. Thanks for the follow-up.

## 2020-04-17 ENCOUNTER — Ambulatory Visit: Payer: PRIVATE HEALTH INSURANCE

## 2020-04-17 ENCOUNTER — Ambulatory Visit: Payer: PRIVATE HEALTH INSURANCE | Attending: Internal Medicine

## 2020-04-17 DIAGNOSIS — Z23 Encounter for immunization: Secondary | ICD-10-CM

## 2020-04-17 NOTE — Progress Notes (Signed)
   Covid-19 Vaccination Clinic  Name:  Tiffany Carter    MRN: [id number] DOB: 1981-03-17  04/17/2020  [name] was observed post Covid-19 immunization for 15 minutes without incident. She was provided with Vaccine Information Sheet and instruction to access the V-Safe system.   [name] was instructed to call 911 with any severe reactions post vaccine: Marland Kitchen Difficulty breathing  . Swelling of face and throat  . A fast heartbeat  . A bad rash all over body  . Dizziness and weakness   Immunizations Administered    Name Date Dose VIS Date Route   Pfizer COVID-19 Vaccine 04/17/2020 10:36 AM 0.3 mL 03/23/2020 Intramuscular   Manufacturer: [place]   Lot: [id number]   [place]: [id number]

## 2020-08-17 ENCOUNTER — Other Ambulatory Visit: Payer: Self-pay | Admitting: Obstetrics & Gynecology

## 2020-08-17 DIAGNOSIS — Z1231 Encounter for screening mammogram for malignant neoplasm of breast: Secondary | ICD-10-CM

## 2020-11-21 ENCOUNTER — Other Ambulatory Visit: Payer: Self-pay

## 2020-11-21 ENCOUNTER — Ambulatory Visit
Admission: RE | Admit: 2020-11-21 | Discharge: 2020-11-21 | Disposition: A | Discharge status: Home / Self Care | Payer: PRIVATE HEALTH INSURANCE | Source: Ambulatory Visit | Attending: Obstetrics & Gynecology | Admitting: Obstetrics & Gynecology

## 2020-11-21 DIAGNOSIS — Z1231 Encounter for screening mammogram for malignant neoplasm of breast: Secondary | ICD-10-CM

## 2022-05-30 ENCOUNTER — Ambulatory Visit (HOSPITAL_BASED_OUTPATIENT_CLINIC_OR_DEPARTMENT_OTHER): Admit: 2022-05-30 | Payer: PRIVATE HEALTH INSURANCE | Admitting: Obstetrics & Gynecology

## 2022-05-30 ENCOUNTER — Encounter (HOSPITAL_BASED_OUTPATIENT_CLINIC_OR_DEPARTMENT_OTHER): Payer: Self-pay

## 2022-05-30 SURGERY — HYSTERECTOMY, VAGINAL, LAPAROSCOPY-ASSISTED, WITH SALPINGECTOMY
Anesthesia: General | Laterality: Bilateral
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
## 2023-02-12 ENCOUNTER — Ambulatory Visit: Payer: No Typology Code available for payment source | Attending: Family Medicine

## 2023-02-12 ENCOUNTER — Other Ambulatory Visit: Payer: Self-pay

## 2023-02-12 DIAGNOSIS — S161XXA Strain of muscle, fascia and tendon at neck level, initial encounter: Secondary | ICD-10-CM | POA: Insufficient documentation

## 2023-02-12 DIAGNOSIS — X58XXXA Exposure to other specified factors, initial encounter: Secondary | ICD-10-CM | POA: Diagnosis not present

## 2023-02-12 DIAGNOSIS — M6281 Muscle weakness (generalized): Secondary | ICD-10-CM

## 2023-02-12 DIAGNOSIS — M542 Cervicalgia: Secondary | ICD-10-CM

## 2023-02-12 DIAGNOSIS — R252 Cramp and spasm: Secondary | ICD-10-CM
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
## 2023-02-26 ENCOUNTER — Other Ambulatory Visit: Payer: Self-pay | Admitting: Obstetrics and Gynecology

## 2023-02-26 DIAGNOSIS — N939 Abnormal uterine and vaginal bleeding, unspecified: Secondary | ICD-10-CM

## 2023-02-27 ENCOUNTER — Ambulatory Visit: Payer: No Typology Code available for payment source

## 2023-02-27 DIAGNOSIS — R252 Cramp and spasm: Secondary | ICD-10-CM

## 2023-02-27 DIAGNOSIS — M6281 Muscle weakness (generalized): Secondary | ICD-10-CM

## 2023-02-27 DIAGNOSIS — S161XXA Strain of muscle, fascia and tendon at neck level, initial encounter: Secondary | ICD-10-CM | POA: Diagnosis not present

## 2023-02-27 DIAGNOSIS — M542 Cervicalgia: Secondary | ICD-10-CM
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
## 2023-02-28 ENCOUNTER — Other Ambulatory Visit (HOSPITAL_COMMUNITY): Payer: No Typology Code available for payment source

## 2023-03-01 ENCOUNTER — Other Ambulatory Visit (HOSPITAL_BASED_OUTPATIENT_CLINIC_OR_DEPARTMENT_OTHER): Payer: Self-pay | Admitting: Obstetrics and Gynecology

## 2023-03-01 ENCOUNTER — Encounter (HOSPITAL_BASED_OUTPATIENT_CLINIC_OR_DEPARTMENT_OTHER): Payer: Self-pay

## 2023-03-01 ENCOUNTER — Ambulatory Visit (HOSPITAL_BASED_OUTPATIENT_CLINIC_OR_DEPARTMENT_OTHER): Payer: No Typology Code available for payment source

## 2023-03-01 ENCOUNTER — Ambulatory Visit: Payer: No Typology Code available for payment source | Admitting: Physical Therapy

## 2023-03-01 DIAGNOSIS — N939 Abnormal uterine and vaginal bleeding, unspecified: Secondary | ICD-10-CM

## 2023-03-04 ENCOUNTER — Ambulatory Visit: Payer: Self-pay

## 2023-03-04 ENCOUNTER — Ambulatory Visit: Payer: No Typology Code available for payment source | Admitting: Rehabilitative and Restorative Service Providers"

## 2023-03-04 ENCOUNTER — Ambulatory Visit (HOSPITAL_BASED_OUTPATIENT_CLINIC_OR_DEPARTMENT_OTHER): Payer: No Typology Code available for payment source

## 2023-03-04 DIAGNOSIS — S161XXA Strain of muscle, fascia and tendon at neck level, initial encounter: Secondary | ICD-10-CM | POA: Diagnosis not present

## 2023-03-04 DIAGNOSIS — M6281 Muscle weakness (generalized): Secondary | ICD-10-CM

## 2023-03-04 DIAGNOSIS — M542 Cervicalgia: Secondary | ICD-10-CM

## 2023-03-04 DIAGNOSIS — R252 Cramp and spasm: Secondary | ICD-10-CM
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
## 2023-03-06 ENCOUNTER — Encounter: Payer: No Typology Code available for payment source | Admitting: Rehabilitative and Restorative Service Providers"

## 2023-03-06 ENCOUNTER — Ambulatory Visit: Payer: No Typology Code available for payment source | Attending: Family Medicine

## 2023-03-06 DIAGNOSIS — M6281 Muscle weakness (generalized): Secondary | ICD-10-CM | POA: Insufficient documentation

## 2023-03-06 DIAGNOSIS — M542 Cervicalgia: Secondary | ICD-10-CM | POA: Insufficient documentation

## 2023-03-06 DIAGNOSIS — R293 Abnormal posture: Secondary | ICD-10-CM | POA: Insufficient documentation

## 2023-03-06 DIAGNOSIS — R252 Cramp and spasm: Secondary | ICD-10-CM | POA: Insufficient documentation
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
## 2023-03-07 ENCOUNTER — Encounter: Payer: No Typology Code available for payment source | Admitting: Rehabilitative and Restorative Service Providers"

## 2023-03-19 ENCOUNTER — Ambulatory Visit: Payer: No Typology Code available for payment source | Admitting: Rehabilitative and Restorative Service Providers"

## 2023-03-19 ENCOUNTER — Encounter: Payer: Self-pay | Admitting: Rehabilitative and Restorative Service Providers"

## 2023-03-19 DIAGNOSIS — M6281 Muscle weakness (generalized): Secondary | ICD-10-CM

## 2023-03-19 DIAGNOSIS — R252 Cramp and spasm: Secondary | ICD-10-CM

## 2023-03-19 DIAGNOSIS — M542 Cervicalgia: Secondary | ICD-10-CM
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
## 2023-03-20 ENCOUNTER — Ambulatory Visit: Payer: No Typology Code available for payment source

## 2023-03-20 DIAGNOSIS — M542 Cervicalgia: Secondary | ICD-10-CM | POA: Diagnosis not present

## 2023-03-20 DIAGNOSIS — M6281 Muscle weakness (generalized): Secondary | ICD-10-CM

## 2023-03-20 DIAGNOSIS — R252 Cramp and spasm: Secondary | ICD-10-CM
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
## 2023-03-25 ENCOUNTER — Encounter: Payer: Self-pay | Admitting: Gastroenterology

## 2023-03-27 ENCOUNTER — Ambulatory Visit: Payer: No Typology Code available for payment source

## 2023-03-27 DIAGNOSIS — M6281 Muscle weakness (generalized): Secondary | ICD-10-CM

## 2023-03-27 DIAGNOSIS — M542 Cervicalgia: Secondary | ICD-10-CM

## 2023-03-27 DIAGNOSIS — R252 Cramp and spasm: Secondary | ICD-10-CM
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
## 2023-03-28 ENCOUNTER — Ambulatory Visit: Payer: No Typology Code available for payment source

## 2023-03-28 DIAGNOSIS — M542 Cervicalgia: Secondary | ICD-10-CM

## 2023-03-28 DIAGNOSIS — R293 Abnormal posture: Secondary | ICD-10-CM

## 2023-03-28 DIAGNOSIS — R252 Cramp and spasm: Secondary | ICD-10-CM

## 2023-03-28 DIAGNOSIS — M6281 Muscle weakness (generalized): Secondary | ICD-10-CM
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
## 2023-04-02 ENCOUNTER — Ambulatory Visit: Payer: No Typology Code available for payment source

## 2023-04-02 DIAGNOSIS — R293 Abnormal posture: Secondary | ICD-10-CM

## 2023-04-02 DIAGNOSIS — M6281 Muscle weakness (generalized): Secondary | ICD-10-CM

## 2023-04-02 DIAGNOSIS — M542 Cervicalgia: Secondary | ICD-10-CM

## 2023-04-02 DIAGNOSIS — R252 Cramp and spasm: Secondary | ICD-10-CM
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
## 2023-04-03 ENCOUNTER — Ambulatory Visit: Payer: No Typology Code available for payment source

## 2023-04-03 DIAGNOSIS — M542 Cervicalgia: Secondary | ICD-10-CM

## 2023-04-03 DIAGNOSIS — M6281 Muscle weakness (generalized): Secondary | ICD-10-CM

## 2023-04-03 DIAGNOSIS — R293 Abnormal posture: Secondary | ICD-10-CM

## 2023-04-03 DIAGNOSIS — R252 Cramp and spasm: Secondary | ICD-10-CM
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
## 2023-06-25 ENCOUNTER — Encounter: Payer: Self-pay | Admitting: Gastroenterology

## 2023-06-26 ENCOUNTER — Ambulatory Visit: Payer: No Typology Code available for payment source | Admitting: Gastroenterology

## 2023-09-04 ENCOUNTER — Ambulatory Visit: Payer: No Typology Code available for payment source | Admitting: Gastroenterology

## 2023-09-25 ENCOUNTER — Encounter: Payer: Self-pay | Admitting: Internal Medicine

## 2023-12-04 ENCOUNTER — Ambulatory Visit: Admitting: Gastroenterology

## 2023-12-04 ENCOUNTER — Encounter: Payer: Self-pay | Admitting: Internal Medicine

## 2023-12-04 ENCOUNTER — Ambulatory Visit: Admitting: Internal Medicine

## 2023-12-04 VITALS — BP 110/68 | HR 68 | Ht 64.0 in | Wt 154.0 lb

## 2023-12-04 DIAGNOSIS — K625 Hemorrhage of anus and rectum: Secondary | ICD-10-CM | POA: Diagnosis not present

## 2023-12-04 DIAGNOSIS — Z800 Family history of malignant neoplasm of digestive organs: Secondary | ICD-10-CM

## 2023-12-04 DIAGNOSIS — Z860100 Personal history of colon polyps, unspecified: Secondary | ICD-10-CM

## 2023-12-04 DIAGNOSIS — Z860101 Personal history of adenomatous and serrated colon polyps: Secondary | ICD-10-CM

## 2023-12-04 MED ORDER — ONDANSETRON HCL 4 MG PO TABS: ORAL_TABLET | ORAL | 0 refills | Status: AC

## 2023-12-04 MED ORDER — SUTAB 1479-225-188 MG PO TABS: 24.0000 | ORAL_TABLET | ORAL | 0 refills | Status: AC

## 2023-12-04 NOTE — Patient Instructions (Signed)
 We have sent the following medications to your pharmacy for you to pick up at your convenience: Sutab , Zofran    You have been scheduled for a colonoscopy. Please follow written instructions given to you at your visit today.   If you use inhalers (even only as needed), please bring them with you on the day of your procedure.  DO NOT TAKE 7 DAYS PRIOR TO TEST- Trulicity (dulaglutide) Ozempic, Wegovy (semaglutide) Mounjaro (tirzepatide) Bydureon Bcise (exanatide extended release)  DO NOT TAKE 1 DAY PRIOR TO YOUR TEST Rybelsus (semaglutide) Adlyxin (lixisenatide) Victoza (liraglutide) Byetta (exanatide) ___________________________________________________________________________   Due to recent changes in healthcare laws, you may see the results of your imaging and laboratory studies on MyChart before your provider has had a chance to review them.  We understand that in some cases there may be results that are confusing or concerning to you. Not all laboratory results come back in the same time frame and the provider may be waiting for multiple results in order to interpret others.  Please give us  48 hours in order for your provider to thoroughly review all the results before contacting the office for clarification of your results.   Thank you for choosing me and [facility].  Norleen Kiang, MD

## 2023-12-04 NOTE — Progress Notes (Signed)
 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  The patient has undergone previous colonoscopy with Tiffany Carter and more recently here with Tiffany Carter.  Last examination March 28, 2020.  She was found to have a small sessile serrated polyp and hyperplastic polyp.  The exam was otherwise normal.  Noted to have hypertrophic anal papilla but no hemorrhoids.  Patient states that in recent months she has had 3 episodes of discrete bright red blood per rectum.  There was associated rectal discomfort.  In between no issues.  She does have occasional loose stools in stressful situations.  Her mother diagnosed with colon cancer around age 76.  Patient has had genetic testing which has been negative for recognized high risk syndromes or genes.  Blood work from March 06, 2023 reveals a normal hemoglobin 13.9.  REVIEW OF SYSTEMS:  All non-GI ROS negative except for sinus and allergy  trouble  Past Medical History:  Diagnosis Date   Allergy     Anemia    Bilateral nephrolithiasis    non obstructing   Constipation    Ectopic pregnancy 2014   Headache(784.0)    history of migraines over 15 years ago   Lymphadenopathy    2 small cervical lymph nodes palpated on exam   PONV (postoperative nausea and vomiting)    Rhinosinusitis    Rotator cuff injury    right   Tendonitis    left   Umbilical hernia     Past Surgical History:  Procedure Laterality Date   APPENDECTOMY     CESAREAN SECTION     CESAREAN SECTION WITH BILATERAL TUBAL LIGATION Bilateral 10/28/2013   Procedure: REPEAT CESAREAN SECTION WITH BILATERAL TUBAL LIGATION;  Surgeon: Tiffany Blumenthal, DO;  Location: [facility];  Service: Obstetrics;  Laterality: Bilateral;  edc 11/04/13   COLONOSCOPY  2017   Tiffany Carter   uterine polyp removed  2018    Social History Tiffany Carter  reports  that she has never smoked. She has never used smokeless tobacco. She reports current alcohol use of about 10.0 standard drinks of alcohol per week. She reports that she does not use drugs.  family history includes Cancer in her mother; Colon cancer in an other family member; Colon cancer (age of onset: 37) in her mother; Heart disease in her maternal grandfather; Stroke in her maternal grandmother and paternal grandfather.  Allergies  Allergen Reactions   Ciprofloxacin Hives and Other (See Comments)    Arm numbness   Percocet [Oxycodone-Acetaminophen ] Other (See Comments)    Loses Memory Can tolerate Vicodin   Penicillins Hives and Other (See Comments)    Has patient had a PCN reaction causing immediate rash, facial/tongue/throat swelling, SOB or lightheadedness with hypotension: Unknown Has patient had a PCN reaction causing severe rash involving mucus membranes or skin necrosis: Unknown Has patient had a PCN reaction that required hospitalization: Unknown Has patient had a PCN reaction occurring within the last 10 years: No If all of the above answers are NO, then may proceed with Cephalosporin use.        PHYSICAL EXAMINATION: Vital signs: BP 110/68   Pulse 68   Ht 5' 4 (1.626 m)   Wt 154 lb (69.9 kg)   BMI 26.43 kg/m   Constitutional: Pleasant, generally well-appearing, no acute distress Psychiatric: alert and oriented x3, cooperative Eyes: extraocular movements intact,  anicteric, conjunctiva pink Mouth: oral pharynx moist, no lesions Neck: supple no lymphadenopathy Cardiovascular: heart regular rate and rhythm, no murmur Lungs: clear to auscultation bilaterally Abdomen: soft, nontender, nondistended, no obvious ascites, no peritoneal signs, normal bowel sounds, no organomegaly Rectal: Deferred to colonoscopy Extremities: no, cyanosis, or lower extremity edema bilaterally Skin: no lesions on visible extremities Neuro: No focal deficits cranial nerves  intact  ASSESSMENT:  1.  Recurrent minor rectal bleeding 2.  Personal history of sessile serrated polyp 2021 3.  Family history of colon cancer in mother age 72   PLAN:  1.  Colonoscopy to evaluate rectal bleeding provide neoplasia surveillance.The nature of the procedure, as well as the risks, benefits, and alternatives were carefully and thoroughly reviewed with the patient. Ample time for discussion and questions allowed. The patient understood, was satisfied, and agreed to proceed. 2.  Further recommendations pending the outcome of colonoscopy.

## 2024-02-06 ENCOUNTER — Encounter: Admitting: Internal Medicine
